# Patient Record
Sex: Male | Born: 1970 | Race: White | Hispanic: No | Smoking: Never smoker
Health system: Southern US, Community
[De-identification: ages and names within clinical notes are randomized; demographics above are authoritative.]

## PROBLEM LIST (undated history)

## (undated) DIAGNOSIS — J189 Pneumonia, unspecified organism: Secondary | ICD-10-CM

## (undated) DIAGNOSIS — G4730 Sleep apnea, unspecified: Secondary | ICD-10-CM

## (undated) HISTORY — PX: KNEE SURGERY: SHX244

## (undated) HISTORY — PX: COLONOSCOPY: SHX174

---
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
## 2009-02-05 ENCOUNTER — Ambulatory Visit: Payer: Self-pay | Admitting: Surgery

## 2009-02-06 ENCOUNTER — Inpatient Hospital Stay (HOSPITAL_COMMUNITY): Admission: EM | Admit: 2009-02-06 | Discharge: 2009-02-13 | Payer: Self-pay | Admitting: Emergency Medicine

## 2009-02-08 ENCOUNTER — Encounter (INDEPENDENT_AMBULATORY_CARE_PROVIDER_SITE_OTHER): Payer: Self-pay | Admitting: Interventional Radiology

## 2009-02-10 ENCOUNTER — Encounter (INDEPENDENT_AMBULATORY_CARE_PROVIDER_SITE_OTHER): Payer: Self-pay | Admitting: Internal Medicine

## 2009-04-16 ENCOUNTER — Encounter: Admission: RE | Admit: 2009-04-16 | Discharge: 2009-04-16 | Payer: Self-pay | Admitting: Family Medicine

## 2009-08-19 ENCOUNTER — Encounter: Admission: RE | Admit: 2009-08-19 | Discharge: 2009-08-19 | Payer: Self-pay | Admitting: Internal Medicine

## 2009-11-01 HISTORY — PX: SCROTUM EXPLORATION: SHX2389

## 2010-03-12 IMAGING — CR DG CHEST DECUBITUS*L*
1 series · 1 of 1 positions shown · non-contrast
Comparison: 02/08/2009

CLINICAL DATA: Evaluate pleural effusion

CHEST - LEFT DECUBITUS

[view not recorded]
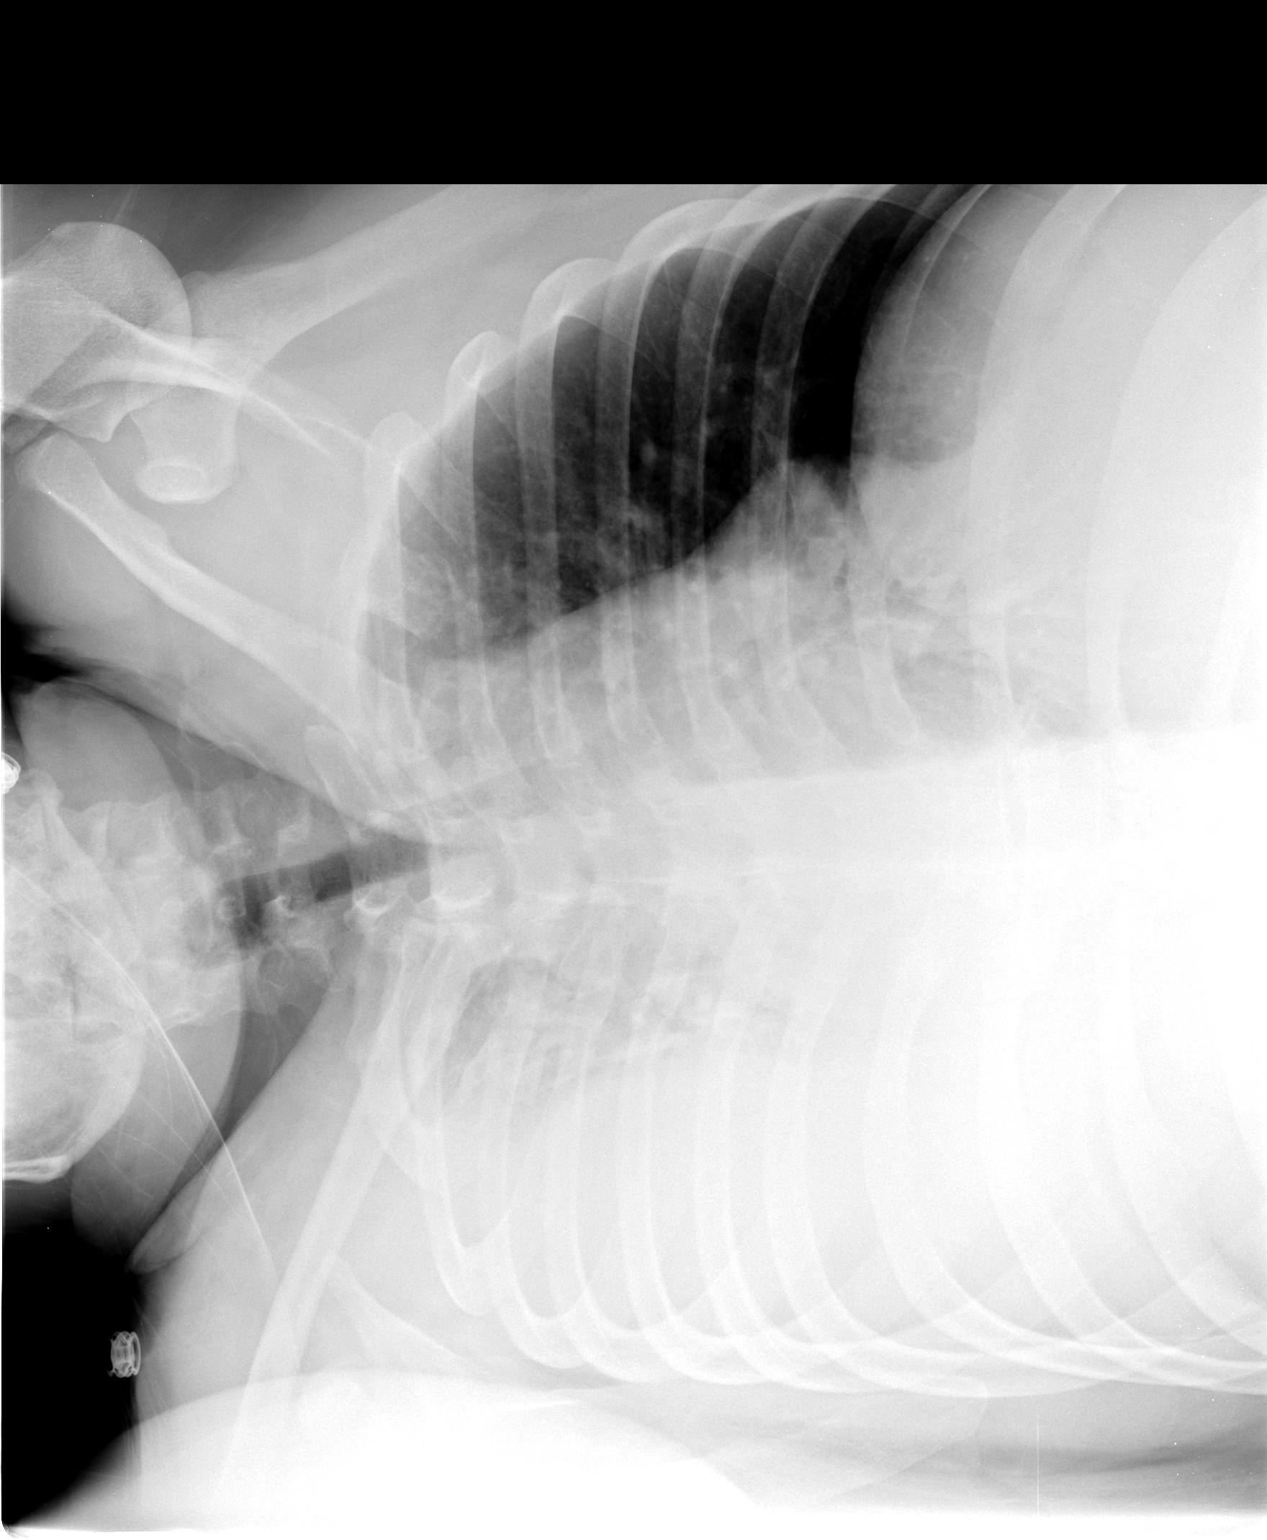

[1 of 1 positions shown; findings below may reference images not displayed]

FINDINGS: The large left pleural effusion is noted to layer along
the dependent portion of the left lung.

No right pleural effusion is noted.
IMPRESSION: 1.  Left pleural effusion appears free-flowing.

## 2010-03-12 IMAGING — US US PARACENTESIS
1 series · 3 of 3 positions shown · non-contrast
Comparison: none

CLINICAL DATA: Left lower lobe pneumonia with rapid development of
left pleural effusion.

LEFTULTRASOUND-GUIDED THORACENTESIS
TECHNIQUE: An ultrasound-guided thoracentesis was thoroughly
discussed with the patient and questions answered.  The benefits,
risks, alternatives and complications were also discussed.  The
patient understands and wishes to proceed with the procedure.  A
verbal as well as written consent was obtained.

[Series 1: us paracentesis · 0.32mm/px · 3 of 3 slices shown]
[im 1/3]
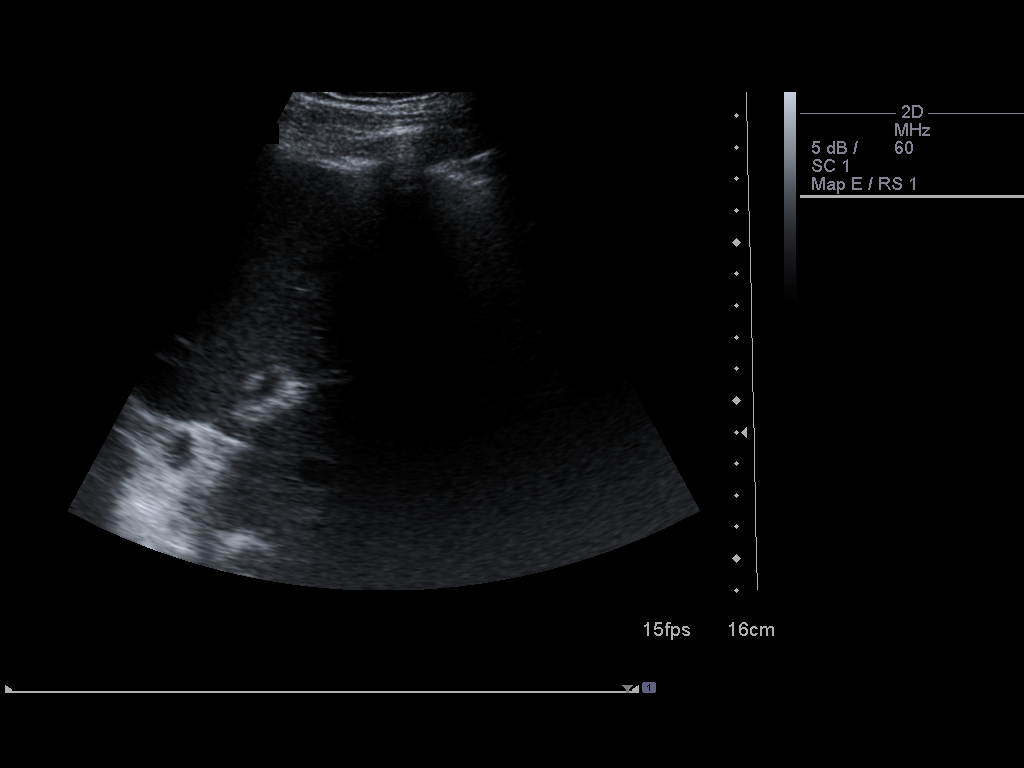
[im 2/3]
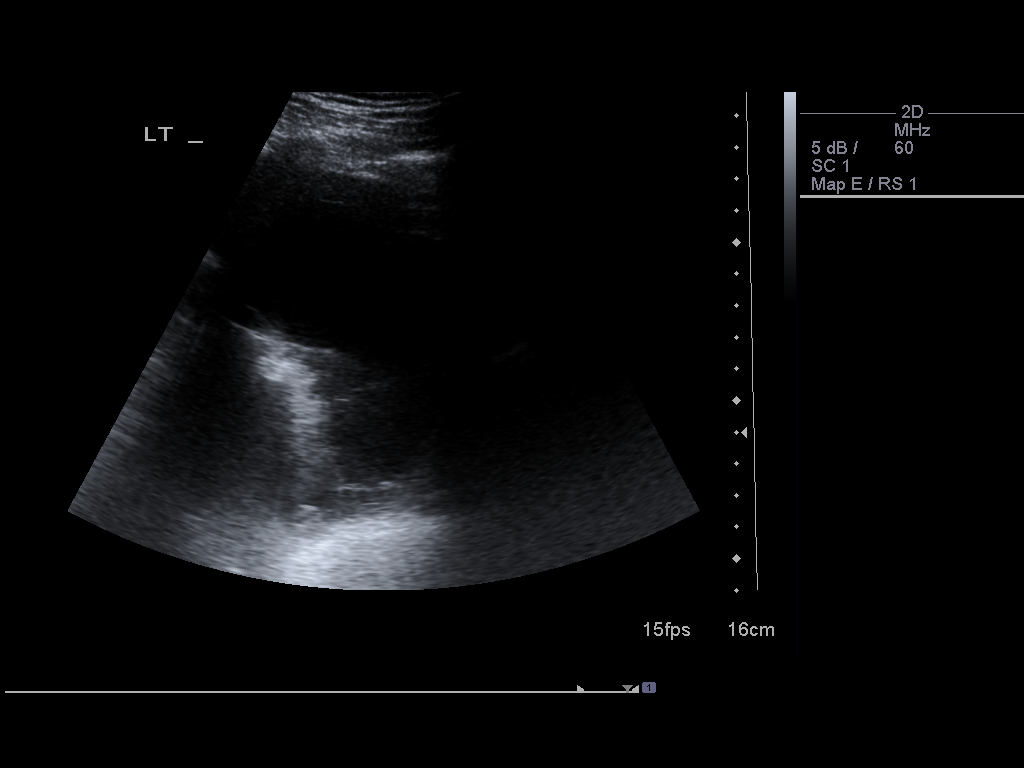
[im 3/3]
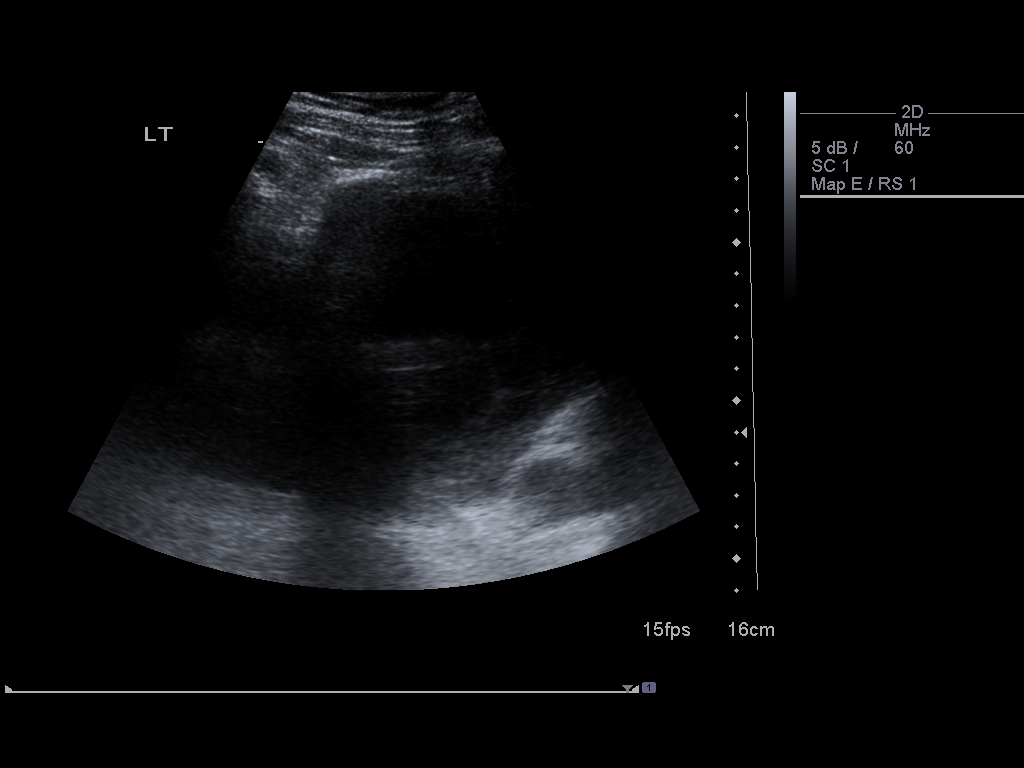

[3 of 3 positions shown; findings below may reference images not displayed]

Ultrasound was performed to localize and mark an adequate pocket of
fluid for thoracentesis.  The left posterior chest wall was prepped
and draped in the normal sterile fashion.  1% Lidocaine was used
for local anesthesia.  Under ultrasound guidance a 19-gauge Yueh
catheter was introduced yielding approximately 500 ml of slightly
turbid fluid.  The patient tolerated the procedure well and there
were no immediate complications. Post procedure chest x-ray is
pending.

By ultrasound, the left pleural effusion appears at least partially
loculated.
IMPRESSION: Successful ultrasound-guided left thoracentesis yielding 500 ml of
slightly turbid fluid.  Samples were sent for requested laboratory
tests.

## 2010-03-14 IMAGING — CR DG CHEST 1V PORT
1 series · 1 of 1 positions shown · non-contrast
Comparison: 02/09/2009

CLINICAL DATA: Pneumonia, follow up empyema drainage, chest tube
placement

PORTABLE CHEST - 1 VIEW

[view not recorded]
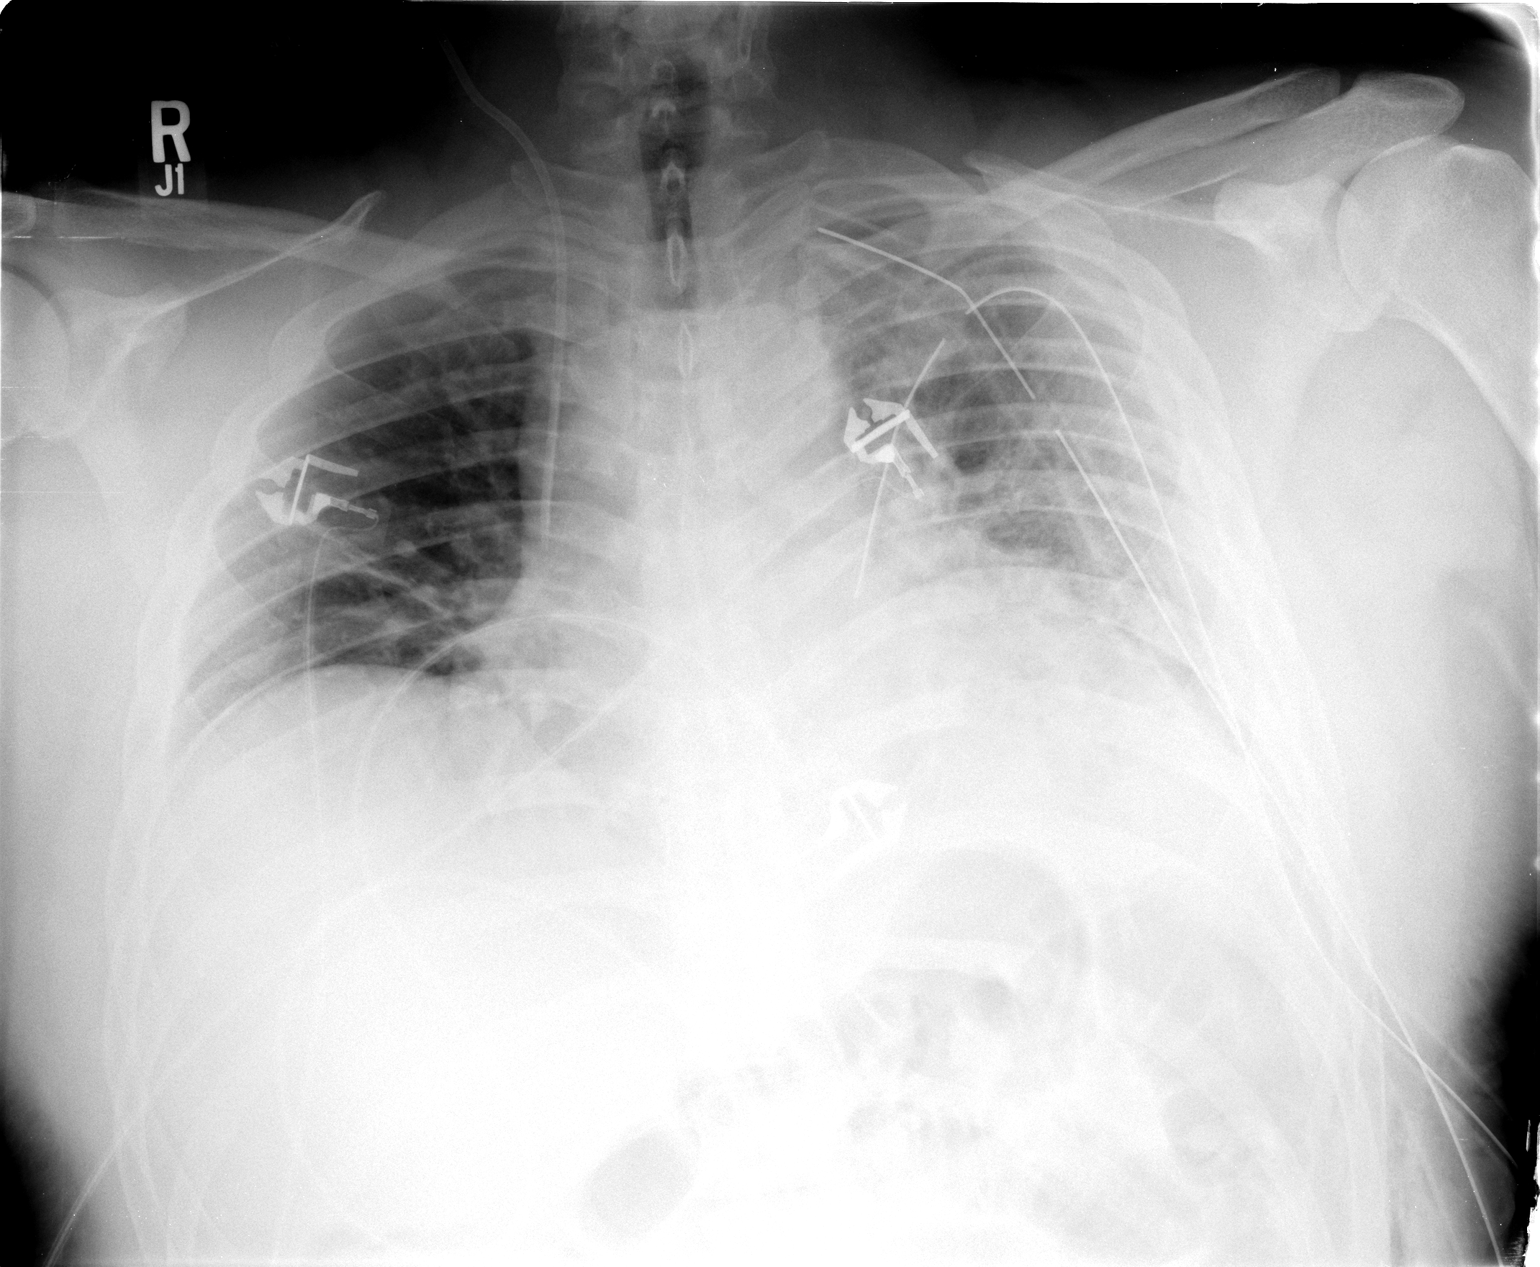

[1 of 1 positions shown; findings below may reference images not displayed]

FINDINGS: Two left chest tubes in good position.  Central venous
catheter unchanged with tip SVC/RA junction.  No pneumothorax seen.
Mild vascular congestion, atelectasis, infiltrate on the left.
Minimal atelectasis right base.  Little change aeration.
IMPRESSION: Stable aeration.

## 2010-03-15 IMAGING — CR DG CHEST 1V PORT
1 series · 1 of 1 positions shown · non-contrast
Comparison: Portable chest x-ray of 02/10/2009

CLINICAL DATA: Status post VATS, follow-up

PORTABLE CHEST - 1 VIEW

[view not recorded]
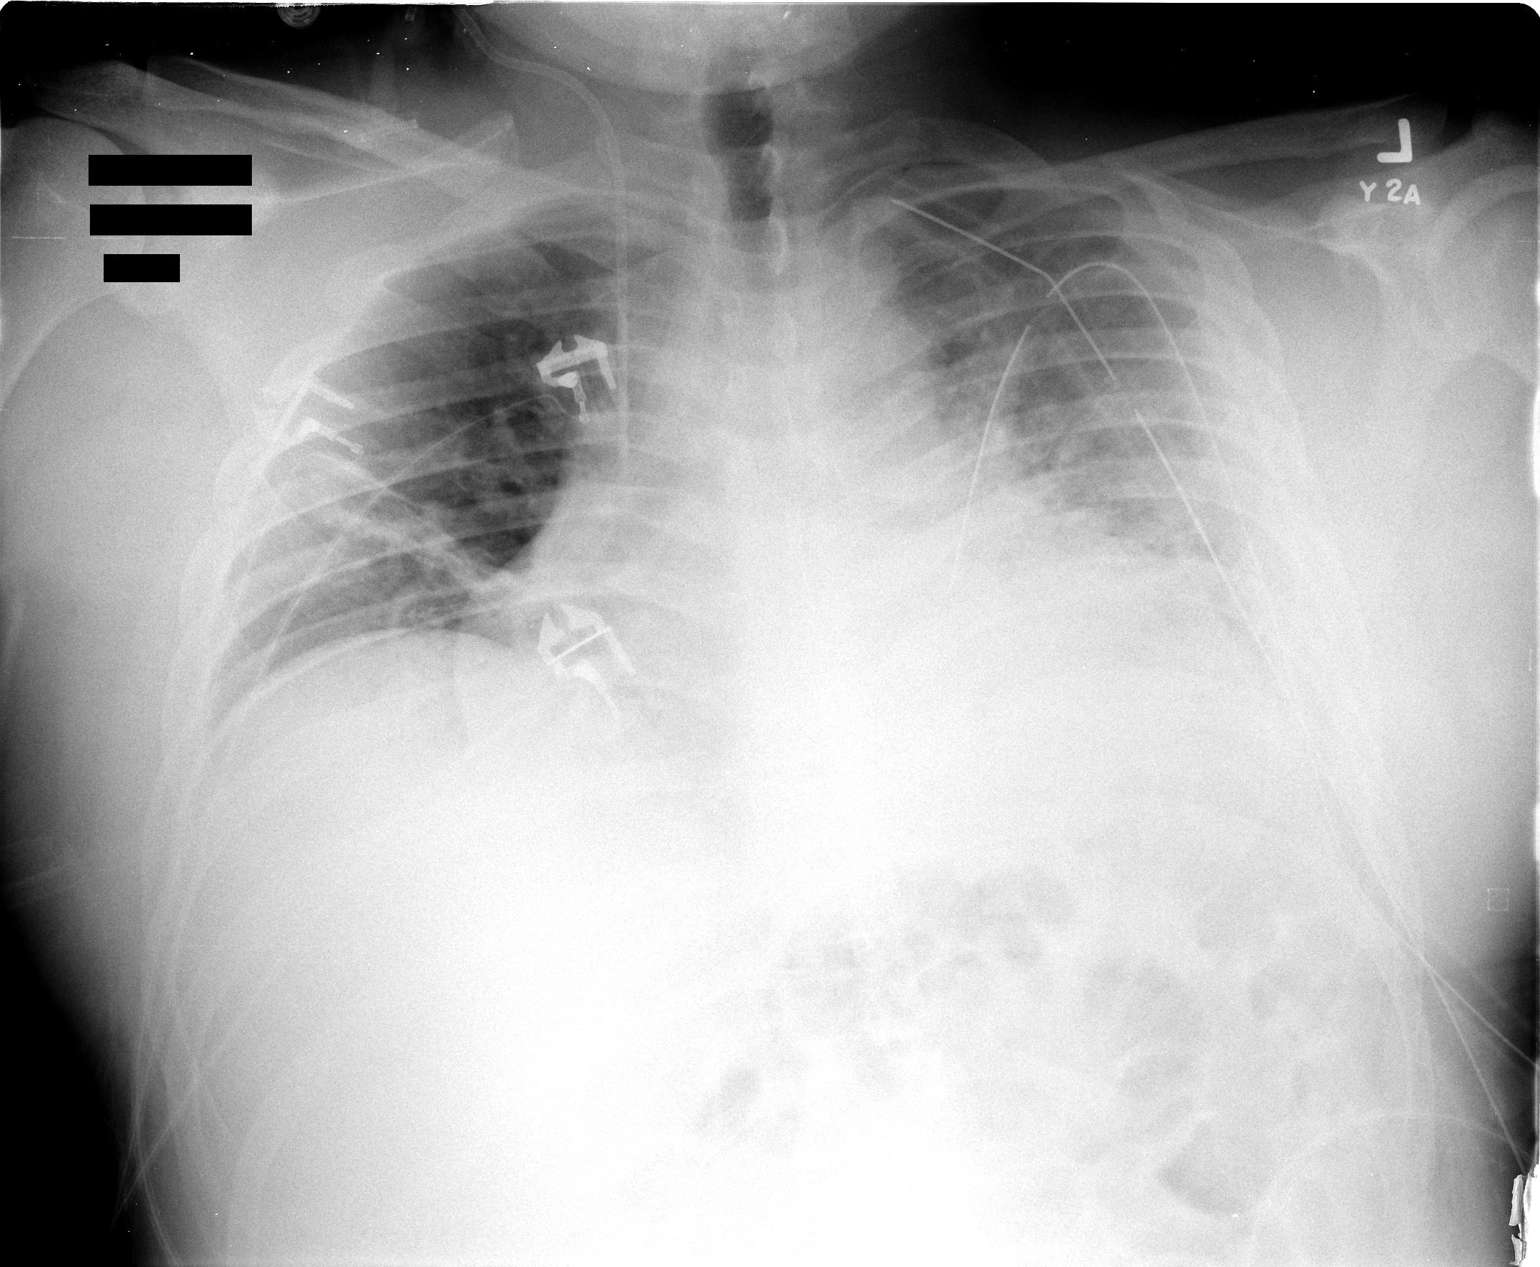

[1 of 1 positions shown; findings below may reference images not displayed]

FINDINGS: Two left chest tubes remain in the lungs remain
suboptimally aerated.  There is some opacity at the lung bases left
greater than right most consistent with atelectasis.  Right IJ
central venous catheter is unchanged in position. Moderate
cardiomegaly is stable.
IMPRESSION: No significant change in poor aeration with bibasilar opacities
left greater than right.

## 2010-03-16 IMAGING — CR DG CHEST 1V PORT
1 series · 1 of 1 positions shown · non-contrast
Comparison: 02/12/2009

CLINICAL DATA: Pneumothorax, chest tube removal.

PORTABLE CHEST - 1 VIEW

[AP]
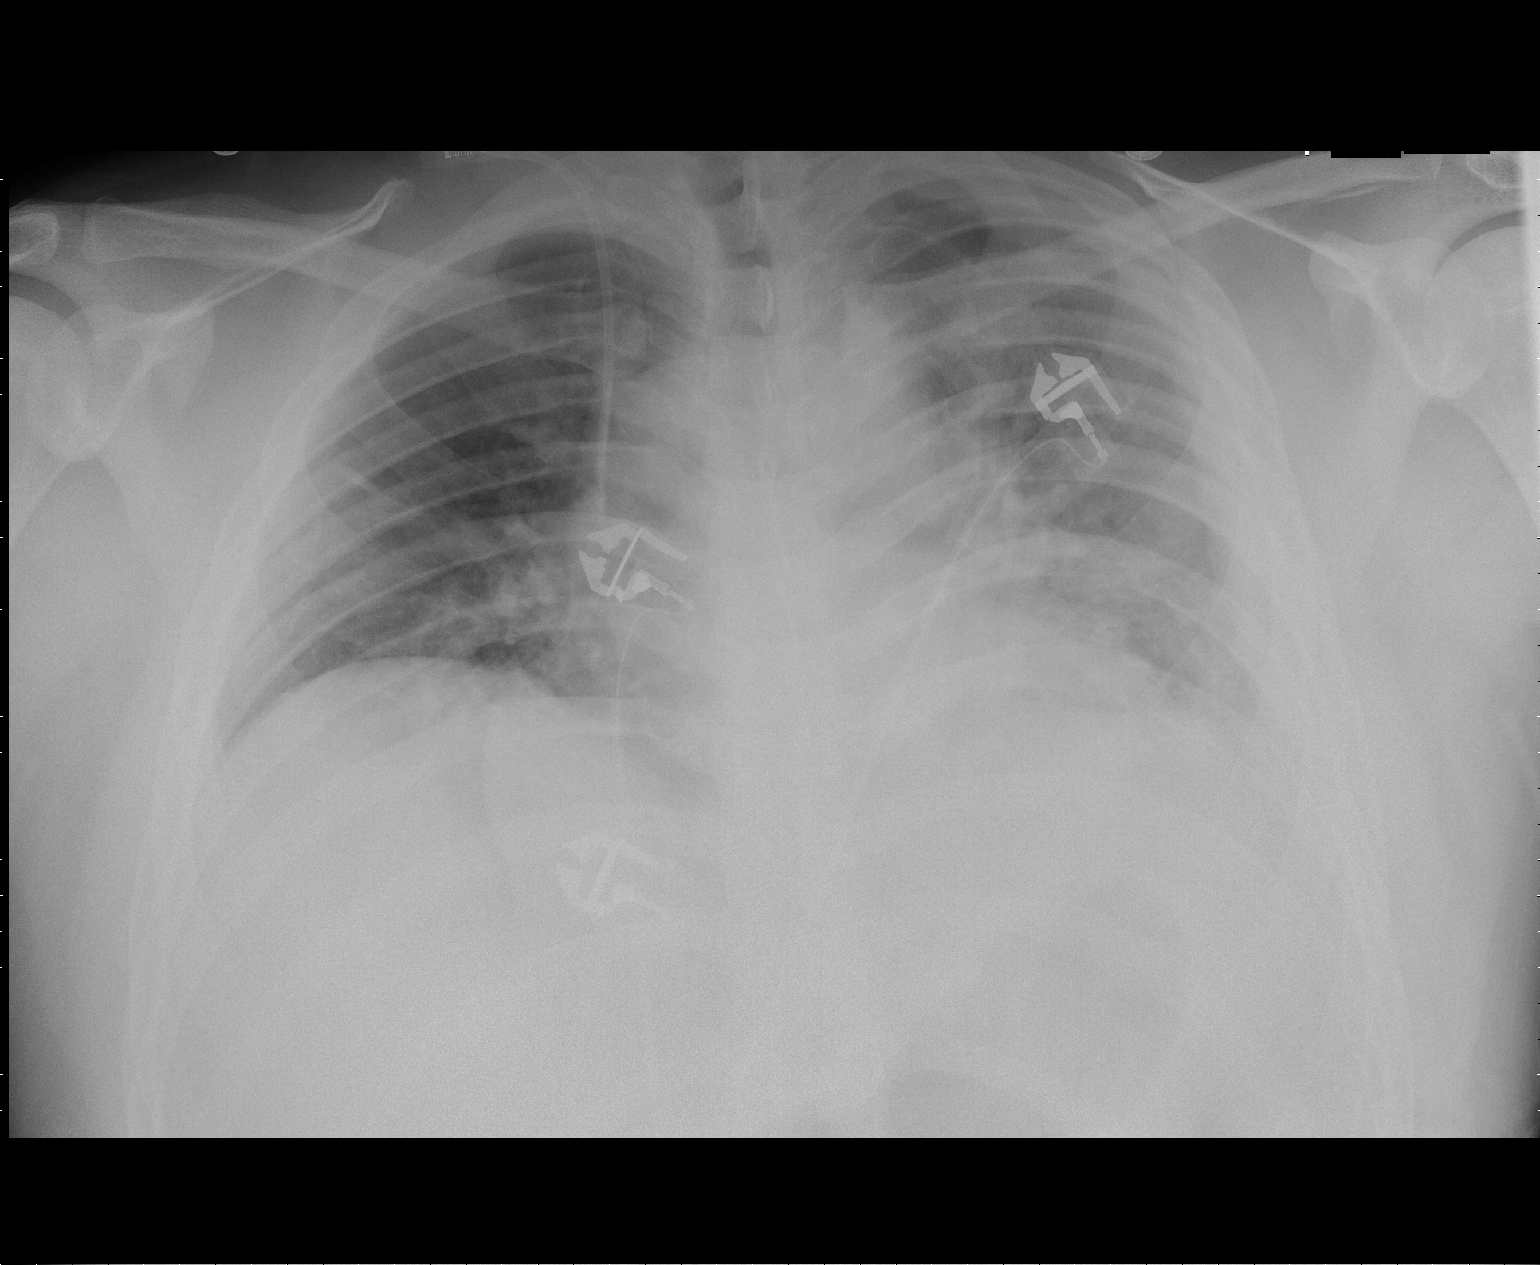

[1 of 1 positions shown; findings below may reference images not displayed]

FINDINGS: Trachea is midline.  Heart size is grossly stable.  Right
IJ central line tip projects over the SVC.  Left chest tube has
been removed in the interval.  Added density in the medial aspect
of the left upper lobe is likely due to a chest tube tract.  No
definite pneumothorax.  Lungs are low in volume with left lung air
space disease.  Right basilar atelectasis.
IMPRESSION: 1.  No definite pneumothorax after left chest tube removal.
2.  Left lung air space disease and left pleural thickening or
fluid.
3.  Right base atelectasis.

## 2010-11-01 HISTORY — PX: KNEE ARTHROPLASTY: SHX992

## 2011-02-10 LAB — BLOOD GAS, ARTERIAL
Acid-Base Excess: 1.9 mmol/L (ref 0.0–2.0)
Bicarbonate: 26.2 mEq/L — ABNORMAL HIGH (ref 20.0–24.0)
TCO2: 27.5 mmol/L (ref 0–100)
pCO2 arterial: 42.8 mmHg (ref 35.0–45.0)
pO2, Arterial: 60.3 mmHg — ABNORMAL LOW (ref 80.0–100.0)

## 2011-02-10 LAB — BASIC METABOLIC PANEL
BUN: 10 mg/dL (ref 6–23)
BUN: 9 mg/dL (ref 6–23)
CO2: 25 mEq/L (ref 19–32)
CO2: 27 mEq/L (ref 19–32)
Calcium: 8.2 mg/dL — ABNORMAL LOW (ref 8.4–10.5)
Calcium: 8.4 mg/dL (ref 8.4–10.5)
Calcium: 8.7 mg/dL (ref 8.4–10.5)
Chloride: 105 mEq/L (ref 96–112)
Chloride: 106 mEq/L (ref 96–112)
Creatinine, Ser: 0.97 mg/dL (ref 0.4–1.5)
Creatinine, Ser: 1.06 mg/dL (ref 0.4–1.5)
GFR calc Af Amer: >60 mL/min (ref >60)
GFR calc Af Amer: >60 mL/min (ref >60)
GFR calc Af Amer: >60 mL/min (ref >60)
GFR calc non Af Amer: >60 mL/min (ref >60)
GFR calc non Af Amer: >60 mL/min (ref >60)
GFR calc non Af Amer: >60 mL/min (ref >60)
GFR calc non Af Amer: >60 mL/min (ref >60)
Glucose, Bld: 86 mg/dL (ref 70–99)
Glucose, Bld: 94 mg/dL (ref 70–99)
Potassium: 3.9 mEq/L (ref 3.5–5.1)
Potassium: 4 mEq/L (ref 3.5–5.1)
Potassium: 4.2 mEq/L (ref 3.5–5.1)
Sodium: 133 mEq/L — ABNORMAL LOW (ref 135–145)
Sodium: 137 mEq/L (ref 135–145)
Sodium: 138 mEq/L (ref 135–145)

## 2011-02-10 LAB — DIFFERENTIAL
Basophils Absolute: 0 10*3/uL (ref 0.0–0.1)
Eosinophils Relative: 5 % (ref 0–5)
Lymphocytes Relative: 24 % (ref 12–46)
Lymphocytes Relative: 9 % — ABNORMAL LOW (ref 12–46)
Lymphs Abs: 1.1 10*3/uL (ref 0.7–4.0)
Lymphs Abs: 2.3 10*3/uL (ref 0.7–4.0)
Monocytes Absolute: 0.8 10*3/uL (ref 0.1–1.0)
Monocytes Relative: 4 % (ref 3–12)
Neutro Abs: 10.7 10*3/uL — ABNORMAL HIGH (ref 1.7–7.7)
Neutrophils Relative %: 62 % (ref 43–77)
Neutrophils Relative %: 86 % — ABNORMAL HIGH (ref 43–77)

## 2011-02-10 LAB — CBC
HCT: 35.6 % — ABNORMAL LOW (ref 39.0–52.0)
HCT: 42.5 % (ref 39.0–52.0)
HCT: 43 % (ref 39.0–52.0)
HCT: 45.8 % (ref 39.0–52.0)
Hemoglobin: 12.4 g/dL — ABNORMAL LOW (ref 13.0–17.0)
Hemoglobin: 12.8 g/dL — ABNORMAL LOW (ref 13.0–17.0)
Hemoglobin: 14.5 g/dL (ref 13.0–17.0)
Hemoglobin: 14.7 g/dL (ref 13.0–17.0)
Hemoglobin: 15.9 g/dL (ref 13.0–17.0)
MCHC: 33.7 g/dL (ref 30.0–36.0)
MCHC: 34.3 g/dL (ref 30.0–36.0)
MCHC: 34.5 g/dL (ref 30.0–36.0)
MCHC: 34.6 g/dL (ref 30.0–36.0)
MCHC: 34.7 g/dL (ref 30.0–36.0)
MCV: 92.3 fL (ref 78.0–100.0)
MCV: 93 fL (ref 78.0–100.0)
Platelets: 238 10*3/uL (ref 150–400)
Platelets: 244 10*3/uL (ref 150–400)
Platelets: 248 10*3/uL (ref 150–400)
RBC: 3.77 MIL/uL — ABNORMAL LOW (ref 4.22–5.81)
RBC: 4.01 MIL/uL — ABNORMAL LOW (ref 4.22–5.81)
RBC: 4.55 MIL/uL (ref 4.22–5.81)
RBC: 4.55 MIL/uL (ref 4.22–5.81)
RDW: 13.2 % (ref 11.5–15.5)
RDW: 13.2 % (ref 11.5–15.5)
RDW: 13.6 % (ref 11.5–15.5)
WBC: 10 10*3/uL (ref 4.0–10.5)
WBC: 12.5 10*3/uL — ABNORMAL HIGH (ref 4.0–10.5)
WBC: 9.4 10*3/uL (ref 4.0–10.5)

## 2011-02-10 LAB — ANAEROBIC CULTURE

## 2011-02-10 LAB — COMPREHENSIVE METABOLIC PANEL
ALT: 22 U/L (ref 0–53)
ALT: 34 U/L (ref 0–53)
ALT: 43 U/L (ref 0–53)
AST: 27 U/L (ref 0–37)
Albumin: 2.6 g/dL — ABNORMAL LOW (ref 3.5–5.2)
CO2: 29 mEq/L (ref 19–32)
Calcium: 8.1 mg/dL — ABNORMAL LOW (ref 8.4–10.5)
Calcium: 8.1 mg/dL — ABNORMAL LOW (ref 8.4–10.5)
Chloride: 105 mEq/L (ref 96–112)
Creatinine, Ser: 1.15 mg/dL (ref 0.4–1.5)
GFR calc Af Amer: >60 mL/min (ref >60)
GFR calc Af Amer: >60 mL/min (ref >60)
GFR calc non Af Amer: >60 mL/min (ref >60)
GFR calc non Af Amer: >60 mL/min (ref >60)
Glucose, Bld: 73 mg/dL (ref 70–99)
Glucose, Bld: 88 mg/dL (ref 70–99)
Potassium: 3.7 mEq/L (ref 3.5–5.1)
Potassium: 3.9 mEq/L (ref 3.5–5.1)
Sodium: 137 mEq/L (ref 135–145)
Sodium: 139 mEq/L (ref 135–145)
Total Bilirubin: 0.6 mg/dL (ref 0.3–1.2)
Total Protein: 5.8 g/dL — ABNORMAL LOW (ref 6.0–8.3)
Total Protein: 7.2 g/dL (ref 6.0–8.3)

## 2011-02-10 LAB — AFB CULTURE WITH SMEAR (NOT AT ARMC)

## 2011-02-10 LAB — URINALYSIS, ROUTINE W REFLEX MICROSCOPIC
Hgb urine dipstick: NEGATIVE
Ketones, ur: NEGATIVE mg/dL
Nitrite: NEGATIVE

## 2011-02-10 LAB — BODY FLUID CELL COUNT WITH DIFFERENTIAL
Eos, Fluid: 1 %
Lymphs, Fluid: 9 %
Monocyte-Macrophage-Serous Fluid: 10 % — ABNORMAL LOW (ref 50–90)

## 2011-02-10 LAB — CROSSMATCH: ABO/RH(D): A POS

## 2011-02-10 LAB — BODY FLUID CULTURE: Culture: NO GROWTH

## 2011-02-10 LAB — POCT CARDIAC MARKERS: Troponin i, poc: <0.05 ng/mL (ref 0.00–0.09)

## 2011-02-10 LAB — POCT I-STAT, CHEM 8
BUN: 17 mg/dL (ref 6–23)
Calcium, Ion: 1.15 mmol/L (ref 1.12–1.32)
Chloride: 106 mEq/L (ref 96–112)
Creatinine, Ser: 1.2 mg/dL (ref 0.4–1.5)

## 2011-02-10 LAB — CK TOTAL AND CKMB (NOT AT ARMC)
CK, MB: 1.6 ng/mL (ref 0.3–4.0)
Relative Index: 0.9 (ref 0.0–2.5)

## 2011-02-10 LAB — LACTATE DEHYDROGENASE, PLEURAL OR PERITONEAL FLUID: LD, Fluid: 1091 U/L — ABNORMAL HIGH (ref 3–23)

## 2011-02-10 LAB — TROPONIN I: Troponin I: <0.01 ng/mL (ref 0.00–0.06)
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
## 2011-03-16 NOTE — Consult Note (Signed)
Glenn Ortiz, STARY NO.:  [id number]   MEDICAL RECORD NO.:  [id number]          PATIENT TYPE:  INP   LOCATION:  2550                         FACILITY:  [facility]   PHYSICIAN:  Evelene Croon, M.D.     DATE OF BIRTH:  1971/06/07   DATE OF CONSULTATION:  02/08/2009  DATE OF DISCHARGE:                                 CONSULTATION   REFERRING PHYSICIAN:  Candyce Churn, MD   REASON FOR CONSULTATION:  Large left pleural effusion, rule out empyema.   CLINICAL HISTORY:  I was asked by Dr. Kevan Ny to evaluate Mr. Ratay for  consideration of drainage of a large left pleural effusion that was felt  to be an empyema.  He is a 40 year old previously-healthy gentleman who  does have a history of pneumothorax about 17 years ago, who presented to  the emergency room on February 05, 2009, with a 4-day history of productive  cough with reddish-pink sputum, left pleuritic chest pain, shortness of  breath, and diaphoresis.  Chest x-ray at the time of presentation showed  left lower lobe pneumonia.  He underwent a CT scan on the chest, which  showed a left lower lobe pneumonia with a small area of parenchymal  crepitation.  There was no significant effusion at the time of  presentation.  There was no significant adenopathy.  CT of the abdomen  and pelvis was negative.  The patient was admitted and started on  intravenous antibiotics.  He remained stable, but on February 08, 2009, he  was noted to have increased shortness of breath, and a followup chest x-  ray showed a large left pleural effusion had  developed.  He underwent  thoracentesis by Interventional Radiology and only 500 mL could be  removed.  It appeared to be partially loculated by ultrasound.  The  fluid was slightly turbid.  Followup chest x-ray showed persistent  moderately left pleural effusion tracking up the left side of the chest,  which I felt was most likely loculated.   PAST MEDICAL HISTORY:  Significant for  history of pneumothorax, 17 years  ago.  He has had no prior surgery.  He denies any medical illnesses.   ALLERGIES:  CODEINE, WHICH CAUSES NAUSEA.   MEDICATIONS PRIOR TO ADMISSION:  1. Delsym p.r.n.  2. Zyrtec p.r.n.  3. Ibuprofen p.r.n.  4. Fish oil 1200 mg daily.  5. Multivitamin daily.   FAMILY HISTORY:  Negative.   SOCIAL HISTORY:  He is a nonsmoker and drinks occasional alcohol.  He  denies any drug use.  He is divorced.   REVIEW OF SYSTEMS:  GENERAL:  He denies any fever or chills.  He has had  no recent weight changes.  He denies fatigue.  EYES:  Negative.  ENT:  Negative.  ENDOCRINE:  He denies diabetes and hypothyroidism.  CARDIOVASCULAR:  He denies any anginal chest pain, but has had pleuritic  left chest pain.  He has had exertional dyspnea for the last 4 days.  He  denies PND and orthopnea.  RESPIRATORY:  He has had cough productive of  reddish-pink sputum.  He denies any wheezing.  GI:  He has had some  abdominal cramping with the stools.  He denies melena and bright red  blood per rectum.  GU:  He denies dysuria and hematuria.  MUSCULOSKELETAL:  He denies arthralgias and myalgias.  NEUROLOGICAL:  He  denies any focal weakness or numbness.  He has been generally weak.  He  has had some headaches.  He has never had a TIA or stroke.  HEMATOLOGICAL:  Negative.   PHYSICAL EXAMINATION:  VITAL SIGNS:  He is afebrile.  Blood pressure is  125/84.  Pulse is 90 and regular.  Respiratory rate is 18 and unlabored.  Oxygen saturation on room air is 92%.  GENERAL:  He is a well-developed muscular white male in no distress.  HEENT:  Normocephalic and atraumatic.  Pupils are equal and reactive to  light and accommodation.  Extraocular muscles are intact.  His throat is  clear.  NECK:  Normal carotid pulses bilaterally.  There is no adenopathy or  thyromegaly.  CARDIAC:  Regular rate and rhythm with normal S1 and S2.  There is no  murmur, rub, or gallop.  LUNGS:  Decreased  breath sounds over the left lower lobe, which were  tubular.  ABDOMEN:  Active bowel sounds.  Abdomen is soft and nontender.  There  are no palpable masses or organomegaly.  EXTREMITIES:  No peripheral edema.  Pedal pulses are palpable  bilaterally.  SKIN:  Warm and dry.   LABORATORY EXAMINATION:  Normal electrolytes with BUN of 10, creatinine  of 1.2.  White blood cell count is steadily increased and is 16.1 today,  hemoglobin of 14.5, hematocrit 43, platelet count 244,000.  Liver  function profile is within normal limits.  Albumin is 3.7.  Urinalysis  is negative.  Cardiac enzymes are negative.  Pleural fluid LDH is 1091  with cell count of 7650 white blood cells, 80% neutrophils, 9%  lymphocytes, 10% monocytes.  ESR is 28.  Culture is pending.   IMPRESSION:  Mr. Tetrault has a loculated moderately large left pleural  effusion in left lower lobe.  He has remained afebrile on antibiotics,  but his white count has been increasing, and he has developed shortness  of breath with minimal exertion such as getting up to sink.  I do not  think there is any role for insertion of the chest tube in this patient  since this is likely loculated thick fluid, which will not be drained  effectively through the chest tube.  I think the best treatment is to  proceed with a left thoracoscopy for complete drainage of the empyema by  breaking up the loculations and decortication of the lung.  I discussed  the operative procedure with the patient including alternatives,  benefits and risks including but not limited to bleeding, blood  transfusion, infection, injury to the lung, persistent pleural fluid  collections, persistent air leak, and he understands and agrees to  proceed.  We will plan to do this tomorrow.      Evelene Croon, M.D.  Electronically Signed     BB/MEDQ  D:  02/09/2009  T:  02/10/2009  Job:  [id number]   cc:   Candyce Churn, M.D.

## 2011-03-16 NOTE — Op Note (Signed)
NAMECAVAN, Glenn Ortiz NO.:  [id number]   MEDICAL RECORD NO.:  [id number]          PATIENT TYPE:  INP   LOCATION:  2550                         FACILITY:  [facility]   PHYSICIAN:  Evelene Croon, M.D.     DATE OF BIRTH:  [birth year]   DATE OF PROCEDURE:  02/09/2009  DATE OF DISCHARGE:                               OPERATIVE REPORT   PREOPERATIVE DIAGNOSIS:  Large left pleural empyema.   POSTOPERATIVE DIAGNOSIS:  Large left pleural empyema.   PROCEDURES:  Left video-assisted thoracoscopy, drainage of empyema, and  decortication of left lung.   ATTENDING SURGEON:  Evelene Croon, MD   ASSISTANT:  Stephanie Acre. Dasovich, PAC   ANESTHESIA:  General endotracheal.   CLINICAL HISTORY:  This patient is a 40 year old healthy gentleman who  presented with a left lower lobe pneumonia and developed progressive  left pleural effusion.  He had a ultrasound-guided left thoracentesis  performed by Interventional Radiology and only 500 mL could be  withdrawn.  The fluid was turbid.  Ultrasound suggested this was a  multiloculated effusion.  Followup chest x-ray showed continued large  left pleural effusion tracking at the left side of the chest, suggesting  that it was loculated.  I felt the best treatment would be thoracoscopy  with drainage and decortication.  I discussed operative procedure with  the patient including alternatives, benefits, and risks including but  not limited to bleeding, blood transfusion, infection, injury to lung,  persistent air leak, and recurrence of fluid collections.  He understood  all this and agreed to proceed.   OPERATIVE PROCEDURE:  The patient was taken to the operating room after  being seen in the holding area and confirming that was the appropriate  patient.  I confirmed the operative side and signed on the left side of  the chest.  In the operating room, the patient was placed under general  endotracheal anesthesia using a double-lumen tube.  He  was turned in the  right lateral decubitus position with the left side up and the left side  of the chest was prepped with Betadine soap and solution and draped in  the usual sterile manner.  Then a small incision was made in the  midaxillary line over the lower left chest wall and a 10-mm trocar was  inserted.  The 30-degree thoracoscope was inserted.  Examination of the  chest showed a complex multiloculated empyema with fibrinous stranding  and small pockets of serous fluid that was slightly turbid.  The lung  and the parietal pleural were both markedly hyperemic and friable.  There was fibrinous exudate over the lung.  Then a second incision was  made in the posterior axillary line slightly higher up.  A trocar was  also introduced through this incision.  Using these two incisions and  pair forceps was used to break up the loculations.  The fluid and debris  was suctioned.  The specimen was sent for culture.  The fibrinous  exudate over the lung was decorticated.  After I felt that all the  loculations were broken up and all the  fluid was removed, the chest was  irrigated with saline solution.  There appeared to be good hemostasis.  Then two chest tubes were placed.  A 36-French right-angle chest tube  was positioned through the anterior axillary incision and positioned in  the posterior costophrenic sulcus.  Then a 36-French straight tube was  positioned through a separate incision just anterior to this and  positioned posteriorly and up to the apex.  The lung was then  reinflated.  The posterior axillary incision was closed using 2-0 Vicryl  subcutaneous suture and a 3-0 Vicryl subcuticular skin closure.  The  sponge, needle, and instrument counts were correct according to the  scrub nurse.  A dry sterile dressing  was applied over the incision and around the chest tubes with short  Pleur-Evac suction.  The patient was then turned in the supine position,  extubated, and transported  to the Postanesthesia Anesthesia Care Unit in  satisfactory and stable condition.      Evelene Croon, M.D.  Electronically Signed     BB/MEDQ  D:  02/09/2009  T:  02/10/2009  Job:  [id number]

## 2011-04-16 LAB — CBC
HCT: 41.3 % (ref 39.0–52.0)
Hemoglobin: 14.3 g/dL (ref 13.0–17.0)
MCH: 30.8 pg (ref 26.0–34.0)
MCV: 89 fL (ref 78.0–100.0)
RBC: 4.64 MIL/uL (ref 4.22–5.81)

## 2011-04-16 LAB — BASIC METABOLIC PANEL
BUN: 20 mg/dL (ref 6–23)
CO2: 24 mEq/L (ref 19–32)
Calcium: 9.4 mg/dL (ref 8.4–10.5)
Glucose, Bld: 84 mg/dL (ref 70–99)
Sodium: 137 mEq/L (ref 135–145)

## 2011-04-20 ENCOUNTER — Ambulatory Visit (HOSPITAL_BASED_OUTPATIENT_CLINIC_OR_DEPARTMENT_OTHER)
Admission: RE | Admit: 2011-04-20 | Discharge: 2011-04-20 | Disposition: A | Discharge status: Home / Self Care | Payer: BC Managed Care – PPO | Source: Ambulatory Visit | Attending: Urology | Admitting: Urology

## 2011-04-20 DIAGNOSIS — N433 Hydrocele, unspecified: Secondary | ICD-10-CM | POA: Insufficient documentation

## 2011-04-20 DIAGNOSIS — Z01812 Encounter for preprocedural laboratory examination: Secondary | ICD-10-CM | POA: Insufficient documentation

## 2011-04-22 NOTE — Op Note (Signed)
  NAME:  Glenn Ortiz, Glenn Ortiz NO.:  [id number]  MEDICAL RECORD NO.:  [id number]  LOCATION:                                 FACILITY:  PHYSICIAN:  Courtney Paris, M.D.  DATE OF BIRTH:  DATE OF PROCEDURE:  04/20/2011 DATE OF DISCHARGE:                              OPERATIVE REPORT   PREOPERATIVE DIAGNOSIS:  Large right hydrocele.  POSTOPERATIVE DIAGNOSIS:  Large right hydrocele.  OPERATION:  Right hydrocelectomy.  ANESTHESIA:  General.  SURGEON:  Courtney Paris, M.D.  BRIEF HISTORY:  This 40 year old white male admitted with increasing right hydrocele for repair.  He had a right inguinal hernia in 1988, but did not have swelling until much later.  Scrotal ultrasound done in December 2011 showed normal right testis.  He enters to have this repaired at this time.  DESCRIPTION OF PROCEDURE:  The patient was placed on the operating room table in the supine position.  After satisfactory induction of general anesthesia, he was prepped and draped with Betadine in the usual sterile fashion.  Time-out was then performed and the patient and procedure then reconfirmed.  He had a very large right hydrocele that measured 15 x 12 cm on the right side.  A midline incision was made, carried down through the scrotal layers and opened the hydrocele sac.  The fluid was aspirated.  The testis was brought into the operative field, which looked normal.  A large hydrocele sac was sutured posteriorly behind the testis with a running 3-0 chromic locking suture.  Hemostasis was good and the testis was placed back in the right hemi-scrotal compartment and the scrotal layer closed with a running 3-0 chromic catgut suture and interrupted 4-0 chromic mattress sutures for the skin.  A 5 cc of 0.25% Marcaine was injected into the incision for postoperative pain relief. A dressing of collodion, Telfa, fluffs and a scrotal support were then applied.  The patient was then taken to the  recovery room in good condition, will be later discharged as an outpatient.     Courtney Paris, M.D.     HMK/MEDQ  D:  04/20/2011  T:  04/20/2011  Job:  [id number]  Electronically Signed by Vic Blackbird M.D. on 04/22/2011 05:35:21 PM

## 2011-08-09 ENCOUNTER — Ambulatory Visit (HOSPITAL_BASED_OUTPATIENT_CLINIC_OR_DEPARTMENT_OTHER)
Admission: RE | Admit: 2011-08-09 | Discharge: 2011-08-09 | Disposition: A | Discharge status: Home / Self Care | Payer: BC Managed Care – PPO | Source: Ambulatory Visit | Attending: Urology | Admitting: Urology

## 2011-08-09 ENCOUNTER — Other Ambulatory Visit: Payer: Self-pay | Admitting: Urology

## 2011-08-09 DIAGNOSIS — Z01812 Encounter for preprocedural laboratory examination: Secondary | ICD-10-CM | POA: Insufficient documentation

## 2011-08-09 DIAGNOSIS — N433 Hydrocele, unspecified: Secondary | ICD-10-CM | POA: Insufficient documentation

## 2011-08-09 DIAGNOSIS — Z302 Encounter for sterilization: Secondary | ICD-10-CM | POA: Insufficient documentation

## 2011-08-18 NOTE — Op Note (Signed)
NAMEKOLEMAN, MARLING NO.:  [id number]  MEDICAL RECORD NO.:  [id number]  LOCATION:                                 FACILITY:  PHYSICIAN:  Valetta Fuller, MD    DATE OF BIRTH:  Dec 17, 1970  DATE OF PROCEDURE:  08/09/2011 DATE OF DISCHARGE:                              OPERATIVE REPORT   PREOPERATIVE DIAGNOSES: 1. Recurrent right hydrocele. 2. Request for elective sterilization.  POSTOPERATIVE DIAGNOSES: 1. Recurrent right hydrocele. 2. Request for elective sterilization.  PROCEDURE PERFORMED:  Scrotal exploration, repair of right multiloculated hydrocele and bilateral vasectomy.  SURGEON:  Valetta Fuller, M.D.  ANESTHESIA:  General.  INDICATIONS:  Mr. Strength came in to see me because of an enlarged right hemiscrotum status post hydrocele repair by Dr. Vic Blackbird, 2-3 months ago.  The patient initially had a large right hydrocele and underwent what was described as uneventful hydrocele repair in the middle of June 2012.  The patient's clinical situation improved, but then he developed a significant progressive recurrent swelling in his right hemiscrotum to the point that it became similar in size as to prior to his original repair.  Clinical exam and ultrasound were consistent with recurrent fluid.  The patient had an ultrasound, which showed a multiloculated fluid collection around the testicle consistent with probable recurrent hydrocele.  The patient also requested bilateral vasectomy if surgery was undertaken.  We discussed the advantages, disadvantages risks, potential complications of this procedure and he elected to proceed with attempt at recurrent repair.  He also appeared to understand the benefits and risks of vasectomy.  TECHNIQUE AND FINDINGS:  The patient was brought to the operating room, where he had successful induction of general anesthesia.  He received perioperative Ancef and PAS compression boots.  He was prepped  and draped in the usual manner.  Standard median raphe incision was utilized.  We found that there was quite a bit of the adhesion to the tunica vaginalis from the dartos muscle.  Once we are able to enter the right hemiscrotal compartment, we found multiloculated pockets of fluid. All those fluid was clear and yellow consistent with seroma and hydrocele fluid.  No evidence of infection or hematoma.  We spent considerable time freeing up the tunica vaginalis from the underlying dartos muscle.  The collections of fluid were opened and then drained. Some redundant tunica vaginalis was excised with electrocautery.  The remaining tunic vaginalis was reefed in a lowered type manner with some plication sutures of Vicryl suture.  The vas deferens was identified. Approximately 2 cm segment was isolated and removed and the ends cauterized.  The more proximal end was buried in a separate adventitial plane from the distal aspect of the vas.  At the completion of the procedure, there did not appear to be any remaining pockets of fluid. The tunica vaginalis for the most part had been excised and the small remaining parts are reefed to reduce the risk of recurrent fluid collection.  The testis otherwise showed some thickening on the tunic albuginea, but no other obvious abnormalities or problems.  Through the median raphe incision, I was able to palpate the left vas.  This was then grasped with the vasal grasper and again a section removed.  The vas was handled in an analogous manner to the right side.  A Marcaine spermatic cord block was performed on the right side.  The testis was returned to the right hemiscrotum.  The scrotal incision was closed with multiple layers of Vicryl suture.  A Penrose drain was also placed on the inferior aspect of the scrotum due to the recurrent nature of the hydrocele to reduce the risk of significant hematoma formation.  This will be left indwelling for several days.   No obvious complications or problems occurred and the patient was brought to recovery room in stable condition.     Valetta Fuller, MD     DSG/MEDQ  D:  08/11/2011  T:  08/11/2011  Job:  [id number]  Electronically Signed by Barron Alvine M.D. on 08/18/2011 10:03:22 AM

## 2013-03-02 ENCOUNTER — Encounter (HOSPITAL_COMMUNITY): Payer: Self-pay | Admitting: *Deleted

## 2013-03-02 ENCOUNTER — Emergency Department (HOSPITAL_COMMUNITY)
Admission: EM | Admit: 2013-03-02 | Discharge: 2013-03-02 | Disposition: A | Discharge status: Home / Self Care | Payer: BC Managed Care – PPO | Attending: Emergency Medicine | Admitting: Emergency Medicine

## 2013-03-02 DIAGNOSIS — Z79899 Other long term (current) drug therapy: Secondary | ICD-10-CM | POA: Insufficient documentation

## 2013-03-02 DIAGNOSIS — Z880 Allergy status to penicillin: Secondary | ICD-10-CM | POA: Insufficient documentation

## 2013-03-02 DIAGNOSIS — M542 Cervicalgia: Secondary | ICD-10-CM

## 2013-03-02 DIAGNOSIS — R209 Unspecified disturbances of skin sensation: Secondary | ICD-10-CM | POA: Insufficient documentation

## 2013-03-02 NOTE — ED Provider Notes (Signed)
History     CSN: [id number]  Arrival date & time 03/02/13  1407   First MD Initiated Contact with Patient 03/02/13 1418      Chief Complaint  Patient presents with  . Neck Pain    (Consider location/radiation/quality/duration/timing/severity/associated sxs/prior treatment) HPI  Glenn Ortiz is a 42 y.o. male complaining of neck pain onset approximately 10 days ago he reports an associated paresthesia to the right fourth and fifth digit. Patient has been seen by his primary care doctor, and orthopedist, has had an MRI and has been evaluated by a chiropractor.  He is sent to the ED by his primary care doctor to get referral to a spine surgeon. He denies any weakness, trauma to the affected area, he does report a reduced range of motion. Rates his pain as severe however it is well-controlled with Percocet that he was given by his physician.  History reviewed. No pertinent past medical history.  History reviewed. No pertinent past surgical history.  History reviewed. No pertinent family history.  History  Substance Use Topics  . Smoking status: Not on file  . Smokeless tobacco: Not on file  . Alcohol Use: No      Review of Systems  Constitutional: Negative for fever.  Respiratory: Negative for shortness of breath.   Cardiovascular: Negative for chest pain.  Gastrointestinal: Negative for nausea, vomiting, abdominal pain and diarrhea.  Musculoskeletal:       Cervicalgia  All other systems reviewed and are negative.    Allergies  Penicillins  Home Medications   Current Outpatient Rx  Name  Route  Sig  Dispense  Refill  . Menthol, Topical Analgesic, (BIOFREEZE EX)   Apply externally   Apply 1 application topically 2 (two) times daily as needed.         . Multiple Vitamins-Minerals (MULTIVITAMIN WITH MINERALS) tablet   Oral   Take 1 tablet by mouth daily.         . naproxen sodium (ANAPROX) 220 MG tablet   Oral   Take 220 mg by mouth 3 (three) times daily as  needed.         . Omega-3 Fatty Acids (FISH OIL) 1000 MG CAPS   Oral   Take 3 capsules by mouth daily.         Marland Kitchen OVER THE COUNTER MEDICATION   Topical   Apply 1 application topically 2 (two) times daily as needed.         . rosuvastatin (CRESTOR) 5 MG tablet   Oral   Take 5 mg by mouth daily.           BP 144/95  Pulse 88  Temp(Src) 97.8 F (36.6 C) (Oral)  Resp 18  SpO2 99%  Physical Exam  Nursing note and vitals reviewed. Constitutional: He is oriented to person, place, and time. He appears well-developed and well-nourished. No distress.  HENT:  Head: Normocephalic.  Mouth/Throat: Oropharynx is clear and moist.  Eyes: Conjunctivae and EOM are normal. Pupils are equal, round, and reactive to light.  Neck: Normal range of motion. Neck supple.  No midline tenderness to palpation or step-offs appreciated. Patient has full range of motion without pain.   Cardiovascular: Normal rate, regular rhythm, normal heart sounds and intact distal pulses.   Pulmonary/Chest: Effort normal and breath sounds normal. No stridor.  Abdominal: Soft.  Musculoskeletal: Normal range of motion.  Neurological: He is alert and oriented to person, place, and time.  Strength is 5 out of 5x4  extremities, neurovascularly intact and sensation is intact to both pinprick and light touch in all distributions on the upper extremities  Psychiatric: He has a normal mood and affect.    ED Course  Procedures (including critical care time)  Labs Reviewed - No data to display No results found.   1. Cervicalgia       MDM   JAK HAGGAR is a 42 y.o. male with neck pain and paresthesia to right fourth and fifth digit, patient has full range of motion in the neck, shoulder, elbow and arm strength is excellent and 5 out of 5 throughout, patient is able to delineate between pinprick and light touch: There is no emergent indication for neurosurgical intervention at this time.  I have given him a  referral to neurosurgeon Dr. Marlyn Corporal, however have explained to him that I do not think that it will be any surgical intervention immediately I have encouraged him to followup with his orthopedist and go through physical therapy.    Filed Vitals:   03/02/13 1413  BP: 144/95  Pulse: 88  Temp: 97.8 F (36.6 C)  TempSrc: Oral  Resp: 18  SpO2: 99%     Pt verbalized understanding and agrees with care plan. Outpatient follow-up and return precautions given.           Wynetta Emery, PA-C 03/02/13 1628  Wynetta Emery, PA-C 03/02/13 2130

## 2013-03-02 NOTE — ED Provider Notes (Signed)
Medical screening examination/treatment/procedure(s) were performed by non-physician practitioner and as supervising physician I was immediately available for consultation/collaboration.  Flint Melter, MD 03/02/13 2219

## 2013-03-02 NOTE — ED Notes (Signed)
Reports having severe neck pain. Has been diagnosed with pinched nerve and sent to chiropractor and ortho md, no relief and pain is radiating down right arm. Ambulatory at triage, no acute dsitress noted at this time.

## 2013-03-12 ENCOUNTER — Ambulatory Visit
Admission: RE | Admit: 2013-03-12 | Discharge: 2013-03-12 | Disposition: A | Discharge status: Home / Self Care | Payer: BC Managed Care – PPO | Source: Ambulatory Visit | Attending: Orthopedic Surgery | Admitting: Orthopedic Surgery

## 2013-03-12 ENCOUNTER — Other Ambulatory Visit: Payer: Self-pay | Admitting: Orthopedic Surgery

## 2013-03-12 DIAGNOSIS — M542 Cervicalgia: Secondary | ICD-10-CM

## 2013-03-12 DIAGNOSIS — M5410 Radiculopathy, site unspecified: Secondary | ICD-10-CM

## 2013-03-27 ENCOUNTER — Other Ambulatory Visit: Payer: Self-pay | Admitting: Orthopedic Surgery

## 2013-03-29 ENCOUNTER — Encounter (HOSPITAL_COMMUNITY): Payer: Self-pay | Admitting: Pharmacy Technician

## 2013-04-02 NOTE — Pre-Procedure Instructions (Addendum)
Glenn Ortiz  04/02/2013   Your procedure is scheduled on:  Thursday, June 5th.  Report to [facility] at 9:10 AM. Enter the hospital through the main Entrance, got to [facility] and take it to 3rd floor, Short Stay.  Call this number if you have problems the morning of surgery: [phone]   Remember:   Do not eat food or drink liquids after midnight.   Take these medicines the morning of surgery with A SIP OF WATER: Take if needed:traMADol (ULTRAM)     Do not wear jewelry, make-up or nail polish.  Do not wear lotions, powders, or perfumes. You may wear deodorant.  Do not shave 48 hours prior to surgery. Men may shave face and neck.  Do not bring valuables to the hospital.  [facility] is not responsible  or any belongings or valuables.  Contacts, dentures or bridgework may not be worn into surgery.  Leave suitcase in the car. After surgery it may be brought to your room.  For patients admitted to the hospital, checkout time is 11:00 AM the day of discharge.   Special Instructions: Shower using CHG 2 nights before surgery and the night before surgery.  If you shower the day of surgery use CHG.  Use special wash - you have one bottle of CHG for all showers.  You should use approximately 1/3 of the bottle for each shower.   Please read over the following fact sheets that you were given: Pain Booklet, Coughing and Deep Breathing, Blood Transfusion Information and Surgical Site Infection Prevention

## 2013-04-03 ENCOUNTER — Encounter (HOSPITAL_COMMUNITY): Payer: Self-pay

## 2013-04-03 ENCOUNTER — Encounter (HOSPITAL_COMMUNITY)
Admission: RE | Admit: 2013-04-03 | Discharge: 2013-04-03 | Disposition: A | Discharge status: Home / Self Care | Payer: BC Managed Care – PPO | Source: Ambulatory Visit | Attending: Orthopedic Surgery | Admitting: Orthopedic Surgery

## 2013-04-03 HISTORY — DX: Pneumonia, unspecified organism: J18.9

## 2013-04-03 LAB — CBC WITH DIFFERENTIAL/PLATELET
Basophils Relative: 0 % (ref 0–1)
Eosinophils Absolute: 0.2 10*3/uL (ref 0.0–0.7)
HCT: 41.3 % (ref 39.0–52.0)
Hemoglobin: 14.9 g/dL (ref 13.0–17.0)
Lymphs Abs: 1.4 10*3/uL (ref 0.7–4.0)
MCH: 32 pg (ref 26.0–34.0)
MCHC: 36.1 g/dL — ABNORMAL HIGH (ref 30.0–36.0)
Monocytes Absolute: 0.6 10*3/uL (ref 0.1–1.0)
Monocytes Relative: 10 % (ref 3–12)
Neutro Abs: 3.5 10*3/uL (ref 1.7–7.7)
RBC: 4.66 MIL/uL (ref 4.22–5.81)

## 2013-04-03 LAB — TYPE AND SCREEN

## 2013-04-03 LAB — URINALYSIS, ROUTINE W REFLEX MICROSCOPIC
Bilirubin Urine: NEGATIVE
Glucose, UA: NEGATIVE mg/dL
Hgb urine dipstick: NEGATIVE
Ketones, ur: NEGATIVE mg/dL
Nitrite: NEGATIVE
Specific Gravity, Urine: 1.016 (ref 1.005–1.030)
pH: 6 (ref 5.0–8.0)

## 2013-04-03 LAB — SURGICAL PCR SCREEN
MRSA, PCR: NEGATIVE
Staphylococcus aureus: POSITIVE — AB

## 2013-04-03 LAB — COMPREHENSIVE METABOLIC PANEL
Albumin: 3.8 g/dL (ref 3.5–5.2)
Alkaline Phosphatase: 59 U/L (ref 39–117)
BUN: 14 mg/dL (ref 6–23)
Chloride: 103 mEq/L (ref 96–112)
Creatinine, Ser: 0.79 mg/dL (ref 0.50–1.35)
GFR calc Af Amer: >90 mL/min (ref >90)
Glucose, Bld: 96 mg/dL (ref 70–99)
Total Bilirubin: 0.6 mg/dL (ref 0.3–1.2)

## 2013-04-03 LAB — PROTIME-INR
INR: 0.99 (ref 0.00–1.49)
Prothrombin Time: 13 seconds (ref 11.6–15.2)

## 2013-04-04 MED ORDER — POVIDONE-IODINE 7.5 % EX SOLN: Freq: Once | CUTANEOUS | Status: DC

## 2013-04-05 ENCOUNTER — Encounter (HOSPITAL_COMMUNITY): Payer: Self-pay | Admitting: *Deleted

## 2013-04-05 ENCOUNTER — Encounter (HOSPITAL_COMMUNITY)
Admission: RE | Disposition: A | Discharge status: Home / Self Care | Payer: Self-pay | Source: Ambulatory Visit | Attending: Orthopedic Surgery

## 2013-04-05 ENCOUNTER — Ambulatory Visit (HOSPITAL_COMMUNITY): Payer: BC Managed Care – PPO | Admitting: Anesthesiology

## 2013-04-05 ENCOUNTER — Encounter (HOSPITAL_COMMUNITY): Payer: Self-pay | Admitting: Anesthesiology

## 2013-04-05 ENCOUNTER — Ambulatory Visit (HOSPITAL_COMMUNITY): Payer: BC Managed Care – PPO

## 2013-04-05 ENCOUNTER — Inpatient Hospital Stay (HOSPITAL_COMMUNITY)
Admission: RE | Admit: 2013-04-05 | Discharge: 2013-04-06 | DRG: 865 | Disposition: A | Discharge status: Home / Self Care | Payer: BC Managed Care – PPO | Source: Ambulatory Visit | Attending: Orthopedic Surgery | Admitting: Orthopedic Surgery

## 2013-04-05 DIAGNOSIS — M5030 Other cervical disc degeneration, unspecified cervical region: Principal | ICD-10-CM | POA: Diagnosis present

## 2013-04-05 HISTORY — PX: ANTERIOR CERVICAL DECOMP/DISCECTOMY FUSION: SHX1161

## 2013-04-05 SURGERY — ANTERIOR CERVICAL DECOMPRESSION/DISCECTOMY FUSION 3 LEVELS
Anesthesia: General | Site: Neck | Wound class: Clean

## 2013-04-05 MED ORDER — ROCURONIUM BROMIDE 100 MG/10ML IV SOLN
INTRAVENOUS | Status: DC | PRN
  Administered 2013-04-05 (×3): 10 mg via INTRAVENOUS
  Administered 2013-04-05: 50 mg via INTRAVENOUS
  Administered 2013-04-05: 10 mg via INTRAVENOUS

## 2013-04-05 MED ORDER — FLEET ENEMA 7-19 GM/118ML RE ENEM: 1.0000 | ENEMA | Freq: Once | RECTAL | Status: AC | PRN

## 2013-04-05 MED ORDER — DOCUSATE SODIUM 100 MG PO CAPS
100.0000 mg | ORAL_CAPSULE | Freq: Two times a day (BID) | ORAL | Status: DC
  Administered 2013-04-05: 100 mg via ORAL
  Filled 2013-04-05 (×3): qty 1

## 2013-04-05 MED ORDER — BISACODYL 5 MG PO TBEC: 5.0000 mg | DELAYED_RELEASE_TABLET | Freq: Every day | ORAL | Status: DC | PRN

## 2013-04-05 MED ORDER — LACTATED RINGERS IV SOLN
Freq: Once | INTRAVENOUS | Status: AC
  Administered 2013-04-05: 13:00:00 via INTRAVENOUS

## 2013-04-05 MED ORDER — LACTATED RINGERS IV SOLN
INTRAVENOUS | Status: DC | PRN
  Administered 2013-04-05: 14:00:00 via INTRAVENOUS

## 2013-04-05 MED ORDER — DEXAMETHASONE SODIUM PHOSPHATE 4 MG/ML IJ SOLN
INTRAMUSCULAR | Status: DC | PRN
  Administered 2013-04-05: 10 mg via INTRAVENOUS

## 2013-04-05 MED ORDER — MENTHOL 3 MG MT LOZG: 1.0000 | LOZENGE | OROMUCOSAL | Status: DC | PRN

## 2013-04-05 MED ORDER — MORPHINE SULFATE 2 MG/ML IJ SOLN
1.0000 mg | INTRAMUSCULAR | Status: DC | PRN
  Administered 2013-04-05 – 2013-04-06 (×3): 2 mg via INTRAVENOUS
  Filled 2013-04-05 (×3): qty 1

## 2013-04-05 MED ORDER — ACETAMINOPHEN 650 MG RE SUPP: 650.0000 mg | RECTAL | Status: DC | PRN

## 2013-04-05 MED ORDER — CEFAZOLIN SODIUM 1-5 GM-% IV SOLN
1.0000 g | Freq: Three times a day (TID) | INTRAVENOUS | Status: AC
  Administered 2013-04-05 – 2013-04-06 (×2): 1 g via INTRAVENOUS
  Filled 2013-04-05 (×2): qty 50

## 2013-04-05 MED ORDER — ACETAMINOPHEN 325 MG PO TABS: 650.0000 mg | ORAL_TABLET | ORAL | Status: DC | PRN

## 2013-04-05 MED ORDER — ONDANSETRON HCL 4 MG/2ML IJ SOLN: 4.0000 mg | INTRAMUSCULAR | Status: DC | PRN

## 2013-04-05 MED ORDER — LIDOCAINE HCL (CARDIAC) 20 MG/ML IV SOLN
INTRAVENOUS | Status: DC | PRN
  Administered 2013-04-05: 80 mg via INTRAVENOUS

## 2013-04-05 MED ORDER — PROMETHAZINE HCL 25 MG/ML IJ SOLN: 6.2500 mg | INTRAMUSCULAR | Status: DC | PRN

## 2013-04-05 MED ORDER — OXYCODONE-ACETAMINOPHEN 5-325 MG PO TABS
ORAL_TABLET | ORAL | Status: AC
  Filled 2013-04-05: qty 2

## 2013-04-05 MED ORDER — BUPIVACAINE-EPINEPHRINE PF 0.25-1:200000 % IJ SOLN
INTRAMUSCULAR | Status: AC
  Filled 2013-04-05: qty 30

## 2013-04-05 MED ORDER — SENNOSIDES-DOCUSATE SODIUM 8.6-50 MG PO TABS: 1.0000 | ORAL_TABLET | Freq: Every evening | ORAL | Status: DC | PRN

## 2013-04-05 MED ORDER — LACTATED RINGERS IV SOLN
INTRAVENOUS | Status: DC | PRN
  Administered 2013-04-05 (×2): via INTRAVENOUS

## 2013-04-05 MED ORDER — PROPOFOL 10 MG/ML IV BOLUS
INTRAVENOUS | Status: DC | PRN
  Administered 2013-04-05: 200 mg via INTRAVENOUS

## 2013-04-05 MED ORDER — HYDROMORPHONE HCL PF 1 MG/ML IJ SOLN
INTRAMUSCULAR | Status: AC
  Filled 2013-04-05: qty 1

## 2013-04-05 MED ORDER — THROMBIN 20000 UNITS EX SOLR
CUTANEOUS | Status: DC | PRN
  Administered 2013-04-05: 15:00:00 via TOPICAL

## 2013-04-05 MED ORDER — SODIUM CHLORIDE 0.9 % IJ SOLN: 3.0000 mL | Freq: Two times a day (BID) | INTRAMUSCULAR | Status: DC

## 2013-04-05 MED ORDER — DIAZEPAM 5 MG PO TABS
5.0000 mg | ORAL_TABLET | Freq: Four times a day (QID) | ORAL | Status: DC | PRN
  Administered 2013-04-05: 5 mg via ORAL

## 2013-04-05 MED ORDER — NEOSTIGMINE METHYLSULFATE 1 MG/ML IJ SOLN
INTRAMUSCULAR | Status: DC | PRN
  Administered 2013-04-05: 4 mg via INTRAVENOUS

## 2013-04-05 MED ORDER — FENTANYL CITRATE 0.05 MG/ML IJ SOLN
INTRAMUSCULAR | Status: DC | PRN
  Administered 2013-04-05 (×13): 50 ug via INTRAVENOUS
  Administered 2013-04-05: 100 ug via INTRAVENOUS

## 2013-04-05 MED ORDER — DIAZEPAM 5 MG PO TABS
ORAL_TABLET | ORAL | Status: AC
  Filled 2013-04-05: qty 1

## 2013-04-05 MED ORDER — OXYCODONE HCL 5 MG PO TABS: 5.0000 mg | ORAL_TABLET | Freq: Once | ORAL | Status: DC | PRN

## 2013-04-05 MED ORDER — SODIUM CHLORIDE 0.9 % IV SOLN: 250.0000 mL | INTRAVENOUS | Status: DC

## 2013-04-05 MED ORDER — GLYCOPYRROLATE 0.2 MG/ML IJ SOLN
INTRAMUSCULAR | Status: DC | PRN
  Administered 2013-04-05: 0.6 mg via INTRAVENOUS

## 2013-04-05 MED ORDER — PHENOL 1.4 % MT LIQD: 1.0000 | OROMUCOSAL | Status: DC | PRN

## 2013-04-05 MED ORDER — VANCOMYCIN HCL IN DEXTROSE 1-5 GM/200ML-% IV SOLN
INTRAVENOUS | Status: AC
  Administered 2013-04-05: 1000 mg via INTRAVENOUS
  Filled 2013-04-05: qty 200

## 2013-04-05 MED ORDER — ALUM & MAG HYDROXIDE-SIMETH 200-200-20 MG/5ML PO SUSP: 30.0000 mL | Freq: Four times a day (QID) | ORAL | Status: DC | PRN

## 2013-04-05 MED ORDER — ATORVASTATIN CALCIUM 10 MG PO TABS
10.0000 mg | ORAL_TABLET | Freq: Every day | ORAL | Status: DC
  Filled 2013-04-05: qty 1

## 2013-04-05 MED ORDER — HYDROMORPHONE HCL PF 1 MG/ML IJ SOLN
0.2500 mg | INTRAMUSCULAR | Status: DC | PRN
  Administered 2013-04-05: 0.5 mg via INTRAVENOUS

## 2013-04-05 MED ORDER — OXYCODONE HCL 5 MG/5ML PO SOLN: 5.0000 mg | Freq: Once | ORAL | Status: DC | PRN

## 2013-04-05 MED ORDER — 0.9 % SODIUM CHLORIDE (POUR BTL) OPTIME
TOPICAL | Status: DC | PRN
  Administered 2013-04-05: 1000 mL

## 2013-04-05 MED ORDER — THROMBIN 20000 UNITS EX SOLR
CUTANEOUS | Status: AC
  Filled 2013-04-05: qty 20000

## 2013-04-05 MED ORDER — MINERAL OIL LIGHT 100 % EX OIL
TOPICAL_OIL | CUTANEOUS | Status: AC
  Filled 2013-04-05: qty 25

## 2013-04-05 MED ORDER — BUPIVACAINE-EPINEPHRINE 0.25% -1:200000 IJ SOLN
INTRAMUSCULAR | Status: DC | PRN
  Administered 2013-04-05: 10 mL

## 2013-04-05 MED ORDER — SODIUM CHLORIDE 0.9 % IJ SOLN: 3.0000 mL | INTRAMUSCULAR | Status: DC | PRN

## 2013-04-05 MED ORDER — ONDANSETRON HCL 4 MG/2ML IJ SOLN
INTRAMUSCULAR | Status: DC | PRN
  Administered 2013-04-05: 4 mg via INTRAVENOUS

## 2013-04-05 MED ORDER — OXYCODONE-ACETAMINOPHEN 5-325 MG PO TABS
1.0000 | ORAL_TABLET | ORAL | Status: DC | PRN
  Administered 2013-04-05 (×2): 2 via ORAL
  Administered 2013-04-06: 1 via ORAL
  Filled 2013-04-05 (×2): qty 2

## 2013-04-05 MED ORDER — MIDAZOLAM HCL 5 MG/5ML IJ SOLN
INTRAMUSCULAR | Status: DC | PRN
  Administered 2013-04-05: 2 mg via INTRAVENOUS

## 2013-04-05 MED ORDER — ZOLPIDEM TARTRATE 5 MG PO TABS: 5.0000 mg | ORAL_TABLET | Freq: Every evening | ORAL | Status: DC | PRN

## 2013-04-05 SURGICAL SUPPLY — 77 items
ACIS DISTRACTION PIN 12MM ×2 IMPLANT
APL SKNCLS STERI-STRIP NONHPOA (GAUZE/BANDAGES/DRESSINGS) ×1
BENZOIN TINCTURE PRP APPL 2/3 (GAUZE/BANDAGES/DRESSINGS) ×2 IMPLANT
BIT DRILL NEURO 2X3.1 SFT TUCH (MISCELLANEOUS) ×1 IMPLANT
BIT DRILL SKYLINE 12MM (BIT) IMPLANT
BLADE SURG 15 STRL LF DISP TIS (BLADE) ×1 IMPLANT
BLADE SURG 15 STRL SS (BLADE) ×2
BLADE SURG ROTATE 9660 (MISCELLANEOUS) ×2 IMPLANT
BUR MATCHSTICK NEURO 3.0 LAGG (BURR) IMPLANT
CARTRIDGE OIL MAESTRO DRILL (MISCELLANEOUS) ×1 IMPLANT
CLOTH BEACON ORANGE TIMEOUT ST (SAFETY) ×2 IMPLANT
CORDS BIPOLAR (ELECTRODE) ×2 IMPLANT
COVER SURGICAL LIGHT HANDLE (MISCELLANEOUS) ×2 IMPLANT
CRADLE DONUT ADULT HEAD (MISCELLANEOUS) ×2 IMPLANT
DEVICE ENDSKLTN IMPLNT MED 6MM (Orthopedic Implant) IMPLANT
DIFFUSER DRILL AIR PNEUMATIC (MISCELLANEOUS) ×2 IMPLANT
DRAIN JACKSON RD 7FR 3/32 (WOUND CARE) IMPLANT
DRAPE C-ARM 42X72 X-RAY (DRAPES) ×2 IMPLANT
DRAPE POUCH INSTRU U-SHP 10X18 (DRAPES) ×2 IMPLANT
DRAPE SURG 17X23 STRL (DRAPES) ×6 IMPLANT
DRILL BIT SKYLINE 12MM (BIT) ×2
DRILL NEURO 2X3.1 SOFT TOUCH (MISCELLANEOUS) ×2
DURAPREP 26ML APPLICATOR (WOUND CARE) ×2 IMPLANT
ELECT COATED BLADE 2.86 ST (ELECTRODE) ×2 IMPLANT
ELECT REM PT RETURN 9FT ADLT (ELECTROSURGICAL) ×2
ELECTRODE REM PT RTRN 9FT ADLT (ELECTROSURGICAL) ×1 IMPLANT
ENDOSKELETON IMPLANT MED 6MM (Orthopedic Implant) ×4 IMPLANT
EVACUATOR SILICONE 100CC (DRAIN) IMPLANT
GAUZE SPONGE 4X4 16PLY XRAY LF (GAUZE/BANDAGES/DRESSINGS) ×2 IMPLANT
GLOVE BIO SURGEON STRL SZ7 (GLOVE) ×2 IMPLANT
GLOVE BIO SURGEON STRL SZ8 (GLOVE) ×2 IMPLANT
GLOVE BIOGEL PI IND STRL 7.0 (GLOVE) ×2 IMPLANT
GLOVE BIOGEL PI IND STRL 8 (GLOVE) ×1 IMPLANT
GLOVE BIOGEL PI INDICATOR 7.0 (GLOVE) ×2
GLOVE BIOGEL PI INDICATOR 8 (GLOVE) ×1
GOWN STRL NON-REIN LRG LVL3 (GOWN DISPOSABLE) ×2 IMPLANT
GOWN STRL REIN XL XLG (GOWN DISPOSABLE) ×2 IMPLANT
INTERLOCK LRDTC CRVCL VBR 6MM (Bone Implant) IMPLANT
IV CATH 14GX2 1/4 (CATHETERS) ×2 IMPLANT
KIT BASIN OR (CUSTOM PROCEDURE TRAY) ×2 IMPLANT
KIT ROOM TURNOVER OR (KITS) ×2 IMPLANT
LORDOTIC CERVICAL VBR 6MM SM (Bone Implant) ×2 IMPLANT
MANIFOLD NEPTUNE II (INSTRUMENTS) ×2 IMPLANT
NDL SPNL 20GX3.5 QUINCKE YW (NEEDLE) ×1 IMPLANT
NEEDLE 27GAX1X1/2 (NEEDLE) ×2 IMPLANT
NEEDLE SPNL 20GX3.5 QUINCKE YW (NEEDLE) ×4 IMPLANT
NS IRRIG 1000ML POUR BTL (IV SOLUTION) ×2 IMPLANT
OIL CARTRIDGE MAESTRO DRILL (MISCELLANEOUS) ×2
PACK ORTHO CERVICAL (CUSTOM PROCEDURE TRAY) ×2 IMPLANT
PAD ARMBOARD 7.5X6 YLW CONV (MISCELLANEOUS) ×4 IMPLANT
PATTIES SURGICAL .5 X.5 (GAUZE/BANDAGES/DRESSINGS) ×1 IMPLANT
PATTIES SURGICAL .5 X1 (DISPOSABLE) ×1 IMPLANT
PIN DISTRACTION 12MM (PIN) ×4
PIN DSTRCT 12XNS SS ACIS (PIN) IMPLANT
PIN TEMP SKYLINE THREADED (PIN) ×1 IMPLANT
PLATE SKYLINE 3 LVL 48MM (Plate) ×1 IMPLANT
PUTTY BONE DBX 5CC MIX (Putty) ×1 IMPLANT
SCREW SKYLINE VAR OS 14MM (Screw) ×1 IMPLANT
SCREW VAR SELF TAP SKYLINE 14M (Screw) ×7 IMPLANT
SPONGE GAUZE 4X4 12PLY (GAUZE/BANDAGES/DRESSINGS) ×2 IMPLANT
SPONGE INTESTINAL PEANUT (DISPOSABLE) ×3 IMPLANT
SPONGE SURGIFOAM ABS GEL 100 (HEMOSTASIS) ×1 IMPLANT
STRIP CLOSURE SKIN 1/2X4 (GAUZE/BANDAGES/DRESSINGS) ×2 IMPLANT
SURGIFLO TRUKIT (HEMOSTASIS) IMPLANT
SUT MNCRL AB 4-0 PS2 18 (SUTURE) ×1 IMPLANT
SUT SILK 4 0 (SUTURE)
SUT SILK 4-0 18XBRD TIE 12 (SUTURE) IMPLANT
SUT VIC AB 2-0 CT2 18 VCP726D (SUTURE) ×2 IMPLANT
SYR BULB IRRIGATION 50ML (SYRINGE) ×2 IMPLANT
SYR CONTROL 10ML LL (SYRINGE) ×2 IMPLANT
TAPE CLOTH 4X10 WHT NS (GAUZE/BANDAGES/DRESSINGS) ×2 IMPLANT
TAPE UMBILICAL COTTON 1/8X30 (MISCELLANEOUS) ×4 IMPLANT
TOWEL OR 17X24 6PK STRL BLUE (TOWEL DISPOSABLE) ×2 IMPLANT
TOWEL OR 17X26 10 PK STRL BLUE (TOWEL DISPOSABLE) ×2 IMPLANT
TRAY FOLEY CATH 14FR (SET/KITS/TRAYS/PACK) ×2 IMPLANT
WATER STERILE IRR 1000ML POUR (IV SOLUTION) ×2 IMPLANT
YANKAUER SUCT BULB TIP NO VENT (SUCTIONS) ×2 IMPLANT

## 2013-04-05 NOTE — Preoperative (Signed)
Beta Blockers   Reason not to administer Beta Blockers:Not Applicable 

## 2013-04-05 NOTE — Progress Notes (Signed)
Patient sitting comfortably in bed. Arm pain is resolved. NVI. Will observe overnight and likely d/c home in the AM.

## 2013-04-05 NOTE — Anesthesia Preprocedure Evaluation (Addendum)
Anesthesia Evaluation  Patient identified by MRN, date of birth, ID band Patient awake    Reviewed: Allergy & Precautions, H&P , NPO status , Patient's Chart, lab work & pertinent test results, reviewed documented beta blocker date and time   Airway Mallampati: II TM Distance: >3 FB Neck ROM: Full    Dental  (+) Teeth Intact and Dental Advisory Given   Pulmonary neg pulmonary ROS, pneumonia -, resolved,          Cardiovascular negative cardio ROS      Neuro/Psych negative psych ROS   GI/Hepatic negative GI ROS, Neg liver ROS,   Endo/Other  negative endocrine ROS  Renal/GU negative Renal ROS     Musculoskeletal   Abdominal   Peds  Hematology   Anesthesia Other Findings   Reproductive/Obstetrics                          Anesthesia Physical Anesthesia Plan  ASA: II  Anesthesia Plan: General   Post-op Pain Management:    Induction: Intravenous  Airway Management Planned: Oral ETT  Additional Equipment:   Intra-op Plan:   Post-operative Plan: Extubation in OR  Informed Consent: I have reviewed the patients History and Physical, chart, labs and discussed the procedure including the risks, benefits and alternatives for the proposed anesthesia with the patient or authorized representative who has indicated his/her understanding and acceptance.   Dental advisory given  Plan Discussed with: CRNA, Anesthesiologist and Surgeon  Anesthesia Plan Comments:        Anesthesia Quick Evaluation

## 2013-04-05 NOTE — Anesthesia Procedure Notes (Signed)
Procedure Name: Intubation Date/Time: 04/05/2013 2:50 PM Performed by: Brien Mates DOBSON Pre-anesthesia Checklist: Patient identified, Emergency Drugs available, Suction available, Patient being monitored and Timeout performed Patient Re-evaluated:Patient Re-evaluated prior to inductionOxygen Delivery Method: Circle system utilized Preoxygenation: Pre-oxygenation with 100% oxygen Intubation Type: IV induction Ventilation: Mask ventilation without difficulty and Oral airway inserted - appropriate to patient size Laryngoscope Size: Miller and 2 Grade View: Grade I Tube type: Oral Tube size: 7.5 mm Number of attempts: 1 Airway Equipment and Method: Stylet Placement Confirmation: ETT inserted through vocal cords under direct vision,  positive ETCO2 and breath sounds checked- equal and bilateral Secured at: 22 cm Tube secured with: Tape Dental Injury: Teeth and Oropharynx as per pre-operative assessment

## 2013-04-05 NOTE — Anesthesia Postprocedure Evaluation (Signed)
  Anesthesia Post-op Note  Patient: Glenn Ortiz  Procedure(s) Performed: Procedure(s) with comments: ANTERIOR CERVICAL DECOMPRESSION/DISCECTOMY FUSION 3 LEVELS (N/A) - Anterior cervical decompression fusion cervical 4-5, cervical 5-6, cervical 6-7 with instrumentation and allograft  Patient Location: PACU  Anesthesia Type:General  Level of Consciousness: awake  Airway and Oxygen Therapy: Patient Spontanous Breathing and Patient connected to nasal cannula oxygen  Post-op Pain: mild  Post-op Assessment: Post-op Vital signs reviewed, Patient's Cardiovascular Status Stable, Respiratory Function Stable, Patent Airway and No signs of Nausea or vomiting  Post-op Vital Signs: Reviewed and stable  Complications: No apparent anesthesia complications

## 2013-04-05 NOTE — H&P (Signed)
PREOPERATIVE H&P  Chief Complaint: right arm pain  HPI: Glenn Ortiz is a 42 y.o. male who presents with 6 weeks of ongoing right arm pain and progressive weakness. Patient failed injections and other forms of conservative care. MRI revealed prominent stenosis and patient elected to proceed with surgical intervention.  Past Medical History  Diagnosis Date  . Pneumonia     hx pleural effusion 5-6 yrs ago   Past Surgical History  Procedure Laterality Date  . Hernia repair Right 88  . Knee arthroplasty Right 11  . Knee surgery Left 89/10/    x3  . Scrotum exploration  10    drain fluid   History   Social History  . Marital Status: Divorced    Spouse Name: N/A    Number of Children: N/A  . Years of Education: N/A   Social History Main Topics  . Smoking status: Never Smoker   . Smokeless tobacco: None     Comment: occ alcohol  . Alcohol Use: Yes  . Drug Use: No  . Sexually Active: None   Other Topics Concern  . None   Social History Narrative  . None   History reviewed. No pertinent family history. Allergies  Allergen Reactions  . Penicillins Nausea And Vomiting    Had as an adult ok   Prior to Admission medications   Medication Sig Start Date End Date Taking? Authorizing Provider  fish oil-omega-3 fatty acids 1000 MG capsule Take 1,000 mg by mouth 3 (three) times daily.   Yes Historical Provider, MD  Multiple Vitamin (MULTIVITAMIN) tablet Take 1 tablet by mouth daily.   Yes Historical Provider, MD  naproxen sodium (ANAPROX) 220 MG tablet Take 220 mg by mouth 2 (two) times daily with a meal.   Yes Historical Provider, MD  rosuvastatin (CRESTOR) 5 MG tablet Take 5 mg by mouth daily.   Yes Historical Provider, MD  traMADol (ULTRAM) 50 MG tablet Take 50 mg by mouth every 6 (six) hours as needed for pain.   Yes Historical Provider, MD     All other systems have been reviewed and were otherwise negative with the exception of those mentioned in the HPI and as  above.  Physical Exam: Filed Vitals:   04/05/13 0906  BP: 127/80  Pulse: 64  Temp: 97.7 F (36.5 C)  Resp: 20    General: Alert, no acute distress Cardiovascular: No pedal edema Respiratory: No cyanosis, no use of accessory musculature GI: No organomegaly, abdomen is soft and non-tender Skin: No lesions in the area of chief complaint Neurologic: Sensation intact distally, 4/5 right wrist extension, triceps, and biceps Psychiatric: Patient is competent for consent with normal mood and affect Lymphatic: No axillary or cervical lymphadenopathy  MUSCULOSKELETAL: + spurling's on right  Assessment/Plan: Cervical radiculitis Plan for Procedure(s): ANTERIOR CERVICAL DECOMPRESSION/DISCECTOMY FUSION 3 LEVELS   Emilee Hero, MD 04/05/2013 1:13 PM

## 2013-04-05 NOTE — Transfer of Care (Signed)
Immediate Anesthesia Transfer of Care Note  Patient: Glenn Ortiz  Procedure(s) Performed: Procedure(s) with comments: ANTERIOR CERVICAL DECOMPRESSION/DISCECTOMY FUSION 3 LEVELS (N/A) - Anterior cervical decompression fusion cervical 4-5, cervical 5-6, cervical 6-7 with instrumentation and allograft  Patient Location: PACU  Anesthesia Type:General  Level of Consciousness: awake  Airway & Oxygen Therapy: Patient Spontanous Breathing and Patient connected to face mask oxygen  Post-op Assessment: Report given to PACU RN and Post -op Vital signs reviewed and stable  Post vital signs: Reviewed and stable  Complications: No apparent anesthesia complications

## 2013-04-06 ENCOUNTER — Encounter (HOSPITAL_COMMUNITY): Payer: Self-pay | Admitting: Orthopedic Surgery

## 2013-04-06 NOTE — Progress Notes (Signed)
UR COMPLETED  

## 2013-04-06 NOTE — Op Note (Signed)
Glenn Ortiz, MALOOF NO.:  [id number]  MEDICAL RECORD NO.:  [id number]  LOCATION:  5N23C                        FACILITY:  [facility]  PHYSICIAN:  Estill Bamberg, MD      DATE OF BIRTH:  1971-03-07  DATE OF PROCEDURE:  04/05/2013                              OPERATIVE REPORT   PREOPERATIVE DIAGNOSES: 1. Severe right-sided cervical radiculopathy with progressive weakness     of his right arm. 2. Cervical degenerative disk disease, C4-5, C5-6, C6-7. 3. Varying degrees of neural foraminal stenoses involving C4-5, C5-6,     as well as C6-7.  POSTOPERATIVE DIAGNOSES: 1. Severe right-sided cervical radiculopathy with progressive weakness     of his right arm. 2. Cervical degenerative disk disease, C4-5, C5-6, C6-7. 3. Varying degrees of neural foraminal stenoses involving C4-5, C5-6,     as well as C6-7.  PROCEDURES: 1. Anterior cervical decompression and fusion, C4-5, C5-6, C6-7. 2. Placement of anterior instrumentation, C4-C7. 3. Insertion of interbody device x3 (6-mm tightened small lordotic     interbody spacers). 4. Use of morselized allograft. 5. Intraoperative use of fluoroscopy.  SURGEON:  Estill Bamberg, MD  ASSISTANT:  Jason Coop, [facility]  ANESTHESIA:  General endotracheal anesthesia.  COMPLICATIONS:  None.  DISPOSITION:  Stable.  ESTIMATED BLOOD LOSS:  Minimal.  INDICATIONS FOR PROCEDURE:  Briefly, Glenn Ortiz is a pleasant 42 year old male who did present to me with severe and debilitating pain in his right arm.  The patient did note progressive weakness as well.  The patient was previously evaluated by Dr. Modesto Charon.  He did have multiple forms of conservative care including physical therapy, injections, and chiropractic care, but did continue to have pain.  Given his progressive weakness and pain in the findings on his MRI, we did have a discussion regarding going forward with a three-level anterior cervical decompression and fusion.  The patient fully  understood the risks and limitations of the procedure as outlined in my preoperative note.  DESCRIPTION OF PROCEDURE:  On April 05, 2013, the patient was brought to surgery and general endotracheal anesthesia was administered.  The patient was placed supine on the hospital bed.  The neck was gently extended.  The shoulders were taped to the inferior aspect of the bed. Time-out procedure was performed and the neck was prepped and draped in the usual sterile fashion.  I then proceeded with making the left-sided transverse incision.  The plane between the sternocleidomastoid muscle laterally and the strap muscles medially was identified and explored. The carotid artery was identified and palpated and retracted laterally. The strap muscles and the esophagus was retracted medially.  The anterior cervical spine was readily identified.  I then obtained a lateral intraoperative fluoroscopic view to confirm the appropriate operative levels.  I then went forward with subperiosteal exposure of the vertebral bodies of C4, C5, C6, and C7.  I then turned my attention towards the C6-7 intervertebral space.  Of note, the omohyoid was very robust and was very much in the way of the appropriate retraction to identify the C6-7 intervertebral space.  The omohyoid was then dissected free and was transected using a Bovie.  The C6-7 intervertebral space was then  identified.  Caspar pins were placed into the C6 and C7 vertebral bodies and gentle distraction was applied across the intervertebral space.  I then used a 15-blade knife to perform an annulotomy anteriorly.  I then used a series of curettes to perform a thorough and complete diskectomy.  The posterior longitudinal ligament was then identified and entered using a nerve hook.  I then went forward with a thorough and complete neural foraminal decompression on both the right and the left sides.  Of particular note, on the right side, there was noted to be  very severe neural foraminal stenosis.  There were 2 large herniated disk fragments that were located in the neural foramen. These were removed using a #2 Kerrison.  I ultimately was then able to pass a nerve hook out the neural foramen on the right side.  This did confirm thorough and adequate decompression.  I then prepared the endplates and I did feel that a 6-mm lordotic spacer would be the most appropriate fit.  This was packed with DBX mix and tamped into position in the usual fashion under distraction.  Distraction was then discontinued and I then proceeded with removing the Caspar pin from the C7 vertebral body.  Bone wax was placed in its place.  I then turned my attention towards the C5-6 interspace.  There were extensive osteophytes noted anteriorly and these were removed using a rongeur.  I then placed a Caspar pin in the C5 vertebral body and distraction was applied.  I then went forward with a diskectomy in the manner described previously. Once again, the posterior longitudinal ligament was entered and a thorough neural foraminal decompression was performed on the right and the left sides.  I again prepared the endplates and placed a series of trials.  I did feel that a 6-mm medium interbody spacer would be the most appropriate fit.  This was packed with the DBX mix and tamped into the appropriate position in the usual fashion.  The Caspar pin from the C6 vertebral body was removed and then the bone wax was placed.  It was then replaced into the C3-4 vertebral body and distraction was applied. A diskectomy was performed in the manner previously described.  Once again, a thorough neural foraminal decompression was performed on both the right and the left sides.  Of note, there was significant concavity of the upper endplate at this level.  I did even out the concavity by liberally using a Kerrison punch.  There was also significant slope of the uncovertebral joints on the  right and the left sides.  This was minimized using a curette.  I then placed a series of trials and I did feel that a small interbody spacer be the most appropriate fit.  Once again, this was packed with DBX mix and tamped into position.  The Caspar pins were then removed and the bone wax was placed in their place.  I then chose an appropriately sized Skyline anterior cervical plate.  The plate was provisionally placed over the anterior cervical spine and a temporary pin was placed to hold the plate provisionally into position.  I then placed a total of 8 self-tapping variable angle screws, 2 in each vertebral body from C4 to C7.  I was very pleased with the purchase of the screws.  I did use AP and lateral fluoroscopy while placing the plate and the hardware to confirm appropriate positioning. I was very pleased with the final construct noted.  At this point,  I copiously irrigated the wound.  I then explored the wound for any undue bleeding.  There were some minor areas of bleeding which were controlled using bipolar electrocautery.  At this point, I did close the platysma using 2-0 Vicryl.  The skin was then closed using 3-0 Monocryl.  Benzoin and Steri-Strips were then applied followed by sterile dressing.  All instrument counts were correct at the termination of the procedure.  Of note, Jason Coop was my assistant throughout the entirety of the procedure, and did aid in essential retraction and suctioning needed throughout the surgery.     Estill Bamberg, MD     MD/MEDQ  D:  04/05/2013  T:  04/06/2013  Job:  [id number]

## 2013-04-06 NOTE — Progress Notes (Signed)
PO day 1 S/P C4-7 ACDF. Doing very well, no R arm pain, some residual numbness in R radial 3 fingers, mild posterior neck pain, mild hoarseness of voice, CC is discomfort and annoyance with IV in R hand.  BP 134/84  Pulse 110  Temp(Src) 98.9 F (37.2 C) (Oral)  Resp 18  SpO2 96% Pt laying comfortably in hospital bed having finished breakfast. Dressing CDI, Hard Collar in place, mildly loose, repositioned appropriately, 2+ DRP, neurovascularly intact.  PO day 1 S/P C4-7 ACDF, resolved R Radiculopathy pain, mild neck pain  -Pain minimal and well controlled on oral meds   -D/C on Percocet/Valium, written scripts in chart  -Hard Collar: repositioned/tightened and discussed importance  -D/C home today  -F/U in office 2wks @scheduled  appt  -NO lifting over 10lbs  -Philly collar to bedside for showering

## 2013-04-16 NOTE — Discharge Summary (Signed)
Patient ID: PAL SHELL MRN: [id number] DOB/AGE: [birth year] 42 y.o.  Admit date: 04/05/2013 Discharge date: 04/06/2013  Admission Diagnoses: R Radiculopathy   Discharge Diagnoses:  Same  Past Medical History  Diagnosis Date  . Pneumonia     hx pleural effusion 5-6 yrs ago    Surgeries: Procedure(s): ANTERIOR CERVICAL DECOMPRESSION/DISCECTOMY FUSION 3 LEVELS C4-7 ACDF on 04/05/2013   Discharged Condition: Improved  Hospital Course: Glenn Ortiz is an 42 y.o. male who was admitted 04/05/2013 for operative treatment of Right radiculopathy. Patient has severe unremitting pain that affects sleep, daily activities, and work/hobbies. After pre-op clearance the patient was taken to the operating room on 04/05/2013 and underwent  Procedure(s): ANTERIOR CERVICAL DECOMPRESSION/DISCECTOMY FUSION 3 LEVELC4-7 ACDF.    Patient was given perioperative antibiotics:  Anti-infectives   Start     Dose/Rate Route Frequency Ordered Stop   04/06/13 0000  ceFAZolin (ANCEF) IVPB 1 g/50 mL premix     1 g 100 mL/hr over 30 Minutes Intravenous Every 8 hours 04/05/13 2040 04/06/13 0753   04/05/13 1414  vancomycin (VANCOCIN) 1 GM/200ML IVPB    Comments:  MAHONY, REBECCA: cabinet override      04/05/13 1414 04/05/13 1429       Patient was given sequential compression devices, early ambulation, and chemoprophylaxis to prevent DVT.  Patient benefited maximally from hospital stay and there were no complications.    Recent vital signs: BP 134/84  Pulse 110  Temp(Src) 98.9 F (37.2 C) (Oral)  Resp 18  SpO2 96%  Discharge Medications:     Medication List    STOP taking these medications       fish oil-omega-3 fatty acids 1000 MG capsule     naproxen sodium 220 MG tablet  Commonly known as:  ANAPROX     traMADol 50 MG tablet  Commonly known as:  ULTRAM      TAKE these medications       multivitamin tablet  Take 1 tablet by mouth daily.     rosuvastatin 5 MG tablet  Commonly known as:   CRESTOR  Take 5 mg by mouth daily.        Diagnostic Studies: Dg Chest 2 View  04/03/2013   *RADIOLOGY REPORT*  Clinical Data: Preop for anterior cervical spine fusion  CHEST - 2 VIEW  Comparison: Chest x-ray of 06/30/2012  Findings: Mild volume loss is noted at the left lung base.  No focal infiltrate or effusion is seen.  The heart is within upper limits of normal and stable.  No acute bony abnormality is seen.  IMPRESSION: Minimal left basilar volume loss.  No active lung disease.   Original Report Authenticated By: Dwyane Dee, M.D.   Dg Cervical Spine 1 View  04/05/2013   *RADIOLOGY REPORT*  Clinical Data: Cervical spine degenerative changes.  Fusion.  DG CERVICAL SPINE - 1 VIEW  Comparison: Cervical spine MR dated 03/12/2013.  Findings: A single lateral C-arm view of the cervical spine demonstrates interval interbody spacer and anterior screw and plate fusion beginning at the C4 level and extending inferiorly.  The inferior portions are obscured by the patient's shoulders.  IMPRESSION: Operative changes, as described above.  Routine views are recommended when possible.   Original Report Authenticated By: Beckie Salts, M.D.   Dg C-arm Gt 120 Min  04/05/2013   *RADIOLOGY REPORT*  Clinical Data: Cervical spine degenerative changes.  Fusion.  DG CERVICAL SPINE - 1 VIEW  Comparison: Cervical spine MR dated 03/12/2013.  Findings: A  single lateral C-arm view of the cervical spine demonstrates interval interbody spacer and anterior screw and plate fusion beginning at the C4 level and extending inferiorly.  The inferior portions are obscured by the patient's shoulders.  IMPRESSION: Operative changes, as described above.  Routine views are recommended when possible.   Original Report Authenticated By: Beckie Salts, M.D.    Disposition: 01-Home or Self Care   PO day 1 S/P C4-7 ACDF, resolved R Radiculopathy pain, mild neck pain  -Pain minimal and well controlled on oral meds  -D/C on Percocet/Valium,  written scripts in chart  -Hard Collar: repositioned/tightened and discussed importance  -D/C home today  -F/U in office 2wks @scheduled  appt  -NO lifting over 10lbs  -Philly collar to bedside for showering         Signed: Georga Bora 04/16/2013, 4:54 PM

## 2014-01-11 ENCOUNTER — Telehealth: Payer: Self-pay

## 2014-01-11 ENCOUNTER — Ambulatory Visit (INDEPENDENT_AMBULATORY_CARE_PROVIDER_SITE_OTHER)
Admission: RE | Admit: 2014-01-11 | Discharge: 2014-01-11 | Disposition: A | Discharge status: Home / Self Care | Payer: BC Managed Care – PPO | Source: Ambulatory Visit | Attending: Internal Medicine | Admitting: Internal Medicine

## 2014-01-11 ENCOUNTER — Encounter: Payer: Self-pay | Admitting: Internal Medicine

## 2014-01-11 ENCOUNTER — Ambulatory Visit (INDEPENDENT_AMBULATORY_CARE_PROVIDER_SITE_OTHER): Payer: BC Managed Care – PPO | Admitting: Internal Medicine

## 2014-01-11 VITALS — BP 132/88 | HR 80 | Ht 69.5 in | Wt 215.0 lb

## 2014-01-11 DIAGNOSIS — R05 Cough: Secondary | ICD-10-CM

## 2014-01-11 DIAGNOSIS — J189 Pneumonia, unspecified organism: Secondary | ICD-10-CM | POA: Insufficient documentation

## 2014-01-11 DIAGNOSIS — R059 Cough, unspecified: Secondary | ICD-10-CM

## 2014-01-11 DIAGNOSIS — R058 Other specified cough: Secondary | ICD-10-CM

## 2014-01-11 MED ORDER — PREDNISONE 10 MG PO TABS: ORAL_TABLET | ORAL | Status: DC

## 2014-01-11 MED ORDER — TRAMADOL HCL 50 MG PO TABS: ORAL_TABLET | ORAL | Status: DC

## 2014-01-11 MED ORDER — PANTOPRAZOLE SODIUM 40 MG PO TBEC: 40.0000 mg | DELAYED_RELEASE_TABLET | Freq: Every day | ORAL | Status: DC

## 2014-01-11 MED ORDER — DOXYCYCLINE HYCLATE 100 MG PO TABS: 100.0000 mg | ORAL_TABLET | Freq: Two times a day (BID) | ORAL | Status: DC

## 2014-01-11 MED ORDER — FAMOTIDINE 20 MG PO TABS: ORAL_TABLET | ORAL | Status: AC

## 2014-01-11 NOTE — Assessment & Plan Note (Addendum)
Not convinced this is realted to 8 year h/o cough (which is more c/w uacs)  And not as likely  pna in absence of fever, purulent sputum but there is def as opacity LLL post basal segment on pa, not well seen on lateral so will rx as CAP and regroup in 2 weeks  rec doxy x 10 d

## 2014-01-11 NOTE — Telephone Encounter (Signed)
Message copied by Velvet BatheAULFIELD, Riel Hirschman L on Fri Jan 11, 2014  5:27 PM ------      Message from: Sandrea HughsWERT, MICHAEL B      Created: Fri Jan 11, 2014  5:24 PM       Call pt:  Reviewed cxr and may have early pna on L so rx doxy x 10 days then ov with cxr in 2 weeks ------

## 2014-01-11 NOTE — Progress Notes (Signed)
   Subjective:    Patient ID: Glenn Ortiz, male    DOB: Jan 24, 1971   MRN: [id number]  HPI   [age] yowm never smoker with daily cough since admission 20008 with ? pna / effusion requiring vats and worse since late Feb 2015.   01/11/2014 1st [place] [facility] office visit/ Wert cc dry cough daily x 7 years first thing in am and never sob when not coughing but now  much worse x 3 weeks sob  with fever took home abx but then pain on L side and worse doe. Seen by Dr Kevan NyGates rx pantoprazole p supper.  Worse p stirs in am but doesn't wake him up.   No obvious other patterns in day to day or daytime variabilty or assoc     chest tightness, subjective wheeze overt sinus or hb symptoms. No unusual exp hx or h/o childhood pna/ asthma or knowledge of premature birth.  Sleeping ok without nocturnal  or early am exacerbation  of respiratory  c/o's or need for noct saba. Also denies any obvious fluctuation of symptoms with weather or environmental changes or other aggravating or alleviating factors except as outlined above   Current Medications, Allergies, Complete Past Medical History, Past Surgical History, Family History, and Social History were reviewed in [facility] electronic medical record.             Review of Systems  Constitutional: Negative for fever and unexpected weight change.  HENT: Negative for congestion, dental problem, ear pain, nosebleeds, postnasal drip, rhinorrhea, sinus pressure, sneezing, sore throat and trouble swallowing.   Eyes: Negative for redness and itching.  Respiratory: Positive for cough and shortness of breath. Negative for chest tightness and wheezing.   Cardiovascular: Negative for palpitations and leg swelling.  Gastrointestinal: Negative for nausea and vomiting.  Genitourinary: Negative for dysuria.  Musculoskeletal: Negative for joint swelling.  Skin: Negative for rash.  Neurological: Positive for headaches.  Hematological: Does not bruise/bleed easily.   Psychiatric/Behavioral: Negative for dysphoric mood. The patient is not nervous/anxious.        Objective:   Physical Exam  Wt Readings from Last 3 Encounters:  01/11/14 215 lb (97.523 kg)  04/03/13 213 lb 10 oz (96.9 kg)     HEENT: nl dentition, turbinates, and orophanx. Nl external ear canals without cough reflex   NECK :  without JVD/Nodes/TM/ nl carotid upstrokes bilaterally   LUNGS: no acc muscle use, clear to A and P bilaterally without cough on insp or exp maneuvers   CV:  RRR  no s3 or murmur or increase in P2, no edema   ABD:  soft and nontender with nl excursion in the supine position. No bruits or organomegaly, bowel sounds nl  MS:  warm without deformities, calf tenderness, cyanosis or clubbing  SKIN: warm and dry without lesions    NEURO:  alert, approp, no deficits    CXR  01/11/2014 : Airspace opacity in the left lung base/left lower lobe concerning for pneumonia.  Low lung volumes.        Assessment & Plan:

## 2014-01-11 NOTE — Telephone Encounter (Signed)
Pt aware of results and recs.  cxr order has been placed for 3/27 appt.  Nothing further needed.

## 2014-01-11 NOTE — Assessment & Plan Note (Signed)

## 2014-01-11 NOTE — Patient Instructions (Addendum)
Prednisone 10 mg take  4 each am x 2 days,   2 each am x 2 days,  1 each am x 2 days and stop  The key to effective treatment for your cough is eliminating the non-stop cycle of cough you're stuck in long enough to let your airway heal completely and then see if there is anything still making you cough once you stop the cough suppression, but this should take no more than 5 days to figure out  First take delsym two tsp every 12 hours and supplement if needed with  tramadol 50 mg up to 2 every 4 hours to suppress the urge to cough at all or even clear your throat. Swallowing water or using ice chips/non mint and menthol containing candies (such as lifesavers or sugarless jolly ranchers) are also effective.  You should rest your voice and avoid activities that you know make you cough.  Once you have eliminated the cough for 3 straight days try reducing the tramadol first,  then the delsym as tolerated.    Try pantoprazole  Take 30-60 min before first meal of the day and Pepcid ac 20 mg one bedtime   GERD (REFLUX)  is an extremely common cause of respiratory symptoms, many times with no significant heartburn at all.    It can be treated with medication, but also with lifestyle changes including avoidance of late meals, excessive alcohol, smoking cessation, and avoid fatty foods, chocolate, peppermint, colas, red wine, and acidic juices such as orange juice.  NO MINT OR MENTHOL PRODUCTS SO NO COUGH DROPS  USE SUGARLESS CANDY INSTEAD (jolley ranchers or Stover's lifesavers  NO OIL BASED VITAMINS - use powdered substitutes.  Please remember to go to the   x-ray department downstairs for your tests - we will call you with the results when they are available. Late add doxy x 10d for ? pna and f/u in 2 weeks

## 2014-01-25 ENCOUNTER — Encounter: Payer: Self-pay | Admitting: Internal Medicine

## 2014-01-25 ENCOUNTER — Ambulatory Visit (INDEPENDENT_AMBULATORY_CARE_PROVIDER_SITE_OTHER): Payer: BC Managed Care – PPO | Admitting: Internal Medicine

## 2014-01-25 VITALS — BP 110/80 | HR 62 | Temp 98.4°F | Ht 69.0 in | Wt 217.4 lb

## 2014-01-25 DIAGNOSIS — J189 Pneumonia, unspecified organism: Secondary | ICD-10-CM

## 2014-01-25 DIAGNOSIS — R058 Other specified cough: Secondary | ICD-10-CM

## 2014-01-25 DIAGNOSIS — R059 Cough, unspecified: Secondary | ICD-10-CM

## 2014-01-25 DIAGNOSIS — R05 Cough: Secondary | ICD-10-CM

## 2014-01-25 NOTE — Progress Notes (Signed)
Subjective:    Patient ID: Glenn Ortiz, male    DOB: Jul 17, 1971   MRN: [id number]    Brief patient profile:  [age] yowm never smoker with daily cough since admission 2008 with ? pna / effusion requiring vats and worse since late Feb 2015.   History of Present Illness  01/11/2014 1st [place] [facility] office visit/ Glenn Ortiz cc dry cough daily x 7 years first thing in am and never sob when not coughing but now  much worse x 3 weeks sob  with fever took home abx but then pain on L side and worse doe. Seen by Dr Glenn Ortiz rx pantoprazole p supper.worse p stirs in am but doesn't wake him up rec Prednisone 10 mg take  4 each am x 2 days,   2 each am x 2 days,  1 each am x 2 days and stop First take delsym two tsp every 12 hours and supplement if needed with  tramadol 50 mg up to 2 every 4 hours to suppress the urge to cough at all or even clear your throat.   Try pantoprazole  Take 30-60 min before first meal of the day and Pepcid ac 20 mg one bedtime  GERD diet Late add doxy x 10d for ? pna and f/u in 2 weeks    01/25/2014 f/u ov/Glenn Ortiz re: chronic cough since 2008 Chief Complaint  Patient presents with  . Follow-up    Pt states cough and SOB much improved since last visit. No new co's today.   pain on L / pressure L similar to prev parapneumonic process, resolved as did  Fever went with abx.Cough is back to baseline, dry daytime daily since, with poor activity tol due to sob, eg climbing steps or getting in a hurry, no real change since 2008     Glenn Ortiz Reflux v Neurogenic Cough Differentiator Reflux Comments  Do you awaken from a sound sleep coughing violently?                            With trouble breathing? no   Do you have choking episodes when you cannot  Get enough air, gasping for air ?              Not now   Do you usually cough when you lie down into  The bed, or when you just lie down to rest ?                          sometimes   Do you usually cough after meals or eating?          Yes   Do you cough when (or after) you bend over?    sometimes   GERD SCORE     Glenn Ortiz Reflux v Neurogenic Cough Differentiator Neurogenic   Do you more-or-less cough all day long? no   Does change of temperature make you cough? no   Does laughing or chuckling cause you to cough? sometimes   Do fumes (perfume, automobile fumes, burned  Toast, etc.,) cause you to cough ?      no   Does speaking, singing, or talking on the phone cause you to cough   ?               no   Neurogenic/Airway score         No other obvious day to day or daytime  variabilty or assoc   cp or chest tightness, subjective wheeze overt sinus or hb symptoms. No unusual exp hx or h/o childhood pna/ asthma or knowledge of premature birth.  Sleeping ok without nocturnal  or early am exacerbation  of respiratory  c/o's or need for noct saba. Also denies any obvious fluctuation of symptoms with weather or environmental changes or other aggravating or alleviating factors except as outlined above   Current Medications, Allergies, Complete Past Medical History, Past Surgical History, Family History, and Social History were reviewed in [facility] record.  ROS  The following are not active complaints unless bolded sore throat, dysphagia, dental problems, itching, sneezing,  nasal congestion or excess/ purulent secretions, ear ache,   fever, chills, sweats, unintended wt loss, pleuritic or exertional cp, hemoptysis,  orthopnea pnd or leg swelling, presyncope, palpitations, heartburn, abdominal pain, anorexia, nausea, vomiting, diarrhea  or change in bowel or urinary habits, change in stools or urine, dysuria,hematuria,  rash, arthralgias, visual complaints, headache, numbness weakness or ataxia or problems with walking or coordination,  change in mood/affect or memory.                      Objective:   Physical Exam  . Wt Readings from Last 3 Encounters:  01/25/14 217 lb 6.4 oz (98.612 kg)   01/11/14 215 lb (97.523 kg)  04/03/13 213 lb 10 oz (96.9 kg)        HEENT: nl dentition, turbinates, and orophanx. Nl external ear canals without cough reflex   NECK :  without JVD/Nodes/TM/ nl carotid upstrokes bilaterally   LUNGS: no acc muscle use,  without cough on insp or exp maneuvers, minimal decrease bs, minimal insp crackles, LLL   CV:  RRR  no s3 or murmur or increase in P2, no edema   ABD:  soft and nontender with nl excursion in the supine position. No bruits or organomegaly, bowel sounds nl  MS:  warm without deformities, calf tenderness, cyanosis or clubbing  SKIN: warm and dry without lesions    NEURO:  alert, approp, no deficits    CXR  01/11/2014 : Airspace opacity in the left lung base/left lower lobe concerning for pneumonia.  Low lung volumes.        Assessment & Plan:

## 2014-01-25 NOTE — Patient Instructions (Addendum)
Pantoprazole (protonix) 40 mg   Take 30-60 min before first meal of the day and Pepcid 20 mg one bedtime until return to office - this is the best way to tell whether stomach acid is contributing to your problem.    See if you can find a dose of tramadol that suppresses the cough and even the urge to cough x 3 straight days  Please schedule a follow up office visit in  About 4 weeks, sooner if needed cxr and pfts on return

## 2014-01-26 NOTE — Assessment & Plan Note (Signed)
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   These are the same pts (now being labeled as having "irritable larynx syndrome" by some cough centers) who not infrequently have a history of having failed to tolerate ace inhibitors,  dry powder inhalers or biphosphonates or report having atypical reflux symptoms that don't respond to standard doses of PPI , and are easily confused as having aecopd or asthma flares by even experienced allergists/ pulmonologists.   For now max rx for gerd then regroup in 4 weeks with pfts to sort out c/o chronic doe

## 2014-01-26 NOTE — Assessment & Plan Note (Signed)
No convicing change on today's cxr vs before abx so likely just seeing scarring in LLL where prev had vats, no further abx planned

## 2014-02-04 ENCOUNTER — Institutional Professional Consult (permissible substitution): Payer: BC Managed Care – PPO | Admitting: Internal Medicine

## 2014-03-01 ENCOUNTER — Ambulatory Visit: Payer: BC Managed Care – PPO | Admitting: Internal Medicine

## 2016-02-05 DIAGNOSIS — R400 Somnolence: Secondary | ICD-10-CM | POA: Diagnosis not present

## 2016-03-01 DIAGNOSIS — G4730 Sleep apnea, unspecified: Secondary | ICD-10-CM | POA: Diagnosis not present

## 2016-03-12 DIAGNOSIS — G4733 Obstructive sleep apnea (adult) (pediatric): Secondary | ICD-10-CM | POA: Diagnosis not present

## 2016-04-12 DIAGNOSIS — G4733 Obstructive sleep apnea (adult) (pediatric): Secondary | ICD-10-CM | POA: Diagnosis not present

## 2016-05-12 DIAGNOSIS — G4733 Obstructive sleep apnea (adult) (pediatric): Secondary | ICD-10-CM | POA: Diagnosis not present

## 2016-05-27 DIAGNOSIS — G4733 Obstructive sleep apnea (adult) (pediatric): Secondary | ICD-10-CM | POA: Diagnosis not present

## 2016-06-12 DIAGNOSIS — G4733 Obstructive sleep apnea (adult) (pediatric): Secondary | ICD-10-CM | POA: Diagnosis not present

## 2016-09-28 DIAGNOSIS — G4733 Obstructive sleep apnea (adult) (pediatric): Secondary | ICD-10-CM | POA: Diagnosis not present

## 2016-12-23 ENCOUNTER — Other Ambulatory Visit: Payer: Self-pay | Admitting: Internal Medicine

## 2016-12-23 DIAGNOSIS — E559 Vitamin D deficiency, unspecified: Secondary | ICD-10-CM | POA: Diagnosis not present

## 2016-12-23 DIAGNOSIS — R5383 Other fatigue: Secondary | ICD-10-CM | POA: Diagnosis not present

## 2016-12-23 DIAGNOSIS — R079 Chest pain, unspecified: Secondary | ICD-10-CM | POA: Diagnosis not present

## 2016-12-23 DIAGNOSIS — E291 Testicular hypofunction: Secondary | ICD-10-CM | POA: Diagnosis not present

## 2016-12-23 DIAGNOSIS — G4733 Obstructive sleep apnea (adult) (pediatric): Secondary | ICD-10-CM | POA: Diagnosis not present

## 2016-12-23 DIAGNOSIS — E782 Mixed hyperlipidemia: Secondary | ICD-10-CM | POA: Diagnosis not present

## 2016-12-23 DIAGNOSIS — Z79899 Other long term (current) drug therapy: Secondary | ICD-10-CM | POA: Diagnosis not present

## 2016-12-24 ENCOUNTER — Inpatient Hospital Stay (HOSPITAL_COMMUNITY): Admission: RE | Admit: 2016-12-24 | Payer: Self-pay | Source: Ambulatory Visit

## 2016-12-28 ENCOUNTER — Telehealth (HOSPITAL_COMMUNITY): Payer: Self-pay

## 2016-12-28 NOTE — Telephone Encounter (Signed)
Duplicate Encounter

## 2016-12-28 NOTE — Telephone Encounter (Signed)
Encounter complete. 

## 2016-12-29 ENCOUNTER — Telehealth (HOSPITAL_COMMUNITY): Payer: Self-pay

## 2016-12-29 NOTE — Telephone Encounter (Signed)
No phone call made. I attempted x2 yesterday. No alternative numbers on file.

## 2016-12-29 NOTE — Telephone Encounter (Signed)
Encounter complete. 

## 2016-12-30 ENCOUNTER — Ambulatory Visit (HOSPITAL_COMMUNITY)
Admission: RE | Admit: 2016-12-30 | Discharge: 2016-12-30 | Disposition: A | Discharge status: Home / Self Care | Payer: BLUE CROSS/BLUE SHIELD | Source: Ambulatory Visit | Attending: Internal Medicine | Admitting: Internal Medicine

## 2016-12-30 DIAGNOSIS — R079 Chest pain, unspecified: Secondary | ICD-10-CM | POA: Insufficient documentation

## 2016-12-30 LAB — EXERCISE TOLERANCE TEST
CHL CUP MPHR: 174 {beats}/min
CSEPHR: 81 %
CSEPPHR: 141 {beats}/min
Estimated workload: 10.5 METS
Exercise duration (min): 9 min
Exercise duration (sec): 18 s
RPE: 18
Rest HR: 66 {beats}/min

## 2017-06-27 DIAGNOSIS — G4733 Obstructive sleep apnea (adult) (pediatric): Secondary | ICD-10-CM | POA: Diagnosis not present

## 2018-02-14 DIAGNOSIS — M1712 Unilateral primary osteoarthritis, left knee: Secondary | ICD-10-CM | POA: Diagnosis not present

## 2018-06-06 DIAGNOSIS — E559 Vitamin D deficiency, unspecified: Secondary | ICD-10-CM | POA: Diagnosis not present

## 2018-06-06 DIAGNOSIS — E782 Mixed hyperlipidemia: Secondary | ICD-10-CM | POA: Diagnosis not present

## 2018-06-06 DIAGNOSIS — Z79899 Other long term (current) drug therapy: Secondary | ICD-10-CM | POA: Diagnosis not present

## 2018-06-06 DIAGNOSIS — E291 Testicular hypofunction: Secondary | ICD-10-CM | POA: Diagnosis not present

## 2018-06-06 DIAGNOSIS — B001 Herpesviral vesicular dermatitis: Secondary | ICD-10-CM | POA: Diagnosis not present

## 2019-02-01 DIAGNOSIS — E782 Mixed hyperlipidemia: Secondary | ICD-10-CM | POA: Diagnosis not present

## 2019-02-01 DIAGNOSIS — Z79899 Other long term (current) drug therapy: Secondary | ICD-10-CM | POA: Diagnosis not present

## 2019-02-01 DIAGNOSIS — E559 Vitamin D deficiency, unspecified: Secondary | ICD-10-CM | POA: Diagnosis not present

## 2019-02-01 DIAGNOSIS — G4733 Obstructive sleep apnea (adult) (pediatric): Secondary | ICD-10-CM | POA: Diagnosis not present

## 2019-02-01 DIAGNOSIS — Z202 Contact with and (suspected) exposure to infections with a predominantly sexual mode of transmission: Secondary | ICD-10-CM | POA: Diagnosis not present

## 2019-02-01 DIAGNOSIS — E291 Testicular hypofunction: Secondary | ICD-10-CM | POA: Diagnosis not present

## 2019-02-01 DIAGNOSIS — Z0000 Encounter for general adult medical examination without abnormal findings: Secondary | ICD-10-CM | POA: Diagnosis not present

## 2019-02-05 DIAGNOSIS — Z202 Contact with and (suspected) exposure to infections with a predominantly sexual mode of transmission: Secondary | ICD-10-CM | POA: Diagnosis not present

## 2020-05-23 DIAGNOSIS — G4733 Obstructive sleep apnea (adult) (pediatric): Secondary | ICD-10-CM | POA: Diagnosis not present

## 2020-05-23 DIAGNOSIS — E291 Testicular hypofunction: Secondary | ICD-10-CM | POA: Diagnosis not present

## 2020-05-23 DIAGNOSIS — E559 Vitamin D deficiency, unspecified: Secondary | ICD-10-CM | POA: Diagnosis not present

## 2020-05-23 DIAGNOSIS — E782 Mixed hyperlipidemia: Secondary | ICD-10-CM | POA: Diagnosis not present

## 2020-05-23 DIAGNOSIS — Z79899 Other long term (current) drug therapy: Secondary | ICD-10-CM | POA: Diagnosis not present

## 2020-08-06 DIAGNOSIS — Z01818 Encounter for other preprocedural examination: Secondary | ICD-10-CM | POA: Diagnosis not present

## 2021-03-11 DIAGNOSIS — M542 Cervicalgia: Secondary | ICD-10-CM | POA: Diagnosis not present

## 2021-03-27 DIAGNOSIS — M542 Cervicalgia: Secondary | ICD-10-CM | POA: Diagnosis not present

## 2021-07-21 DIAGNOSIS — M542 Cervicalgia: Secondary | ICD-10-CM | POA: Diagnosis not present

## 2021-07-29 DIAGNOSIS — S161XXD Strain of muscle, fascia and tendon at neck level, subsequent encounter: Secondary | ICD-10-CM | POA: Diagnosis not present

## 2021-12-14 DIAGNOSIS — E782 Mixed hyperlipidemia: Secondary | ICD-10-CM | POA: Diagnosis not present

## 2021-12-14 DIAGNOSIS — K429 Umbilical hernia without obstruction or gangrene: Secondary | ICD-10-CM | POA: Diagnosis not present

## 2021-12-14 DIAGNOSIS — E291 Testicular hypofunction: Secondary | ICD-10-CM | POA: Diagnosis not present

## 2021-12-14 DIAGNOSIS — E559 Vitamin D deficiency, unspecified: Secondary | ICD-10-CM | POA: Diagnosis not present

## 2021-12-14 DIAGNOSIS — Z125 Encounter for screening for malignant neoplasm of prostate: Secondary | ICD-10-CM | POA: Diagnosis not present

## 2021-12-14 DIAGNOSIS — G4733 Obstructive sleep apnea (adult) (pediatric): Secondary | ICD-10-CM | POA: Diagnosis not present

## 2021-12-14 DIAGNOSIS — Z79899 Other long term (current) drug therapy: Secondary | ICD-10-CM | POA: Diagnosis not present

## 2021-12-14 DIAGNOSIS — Z0000 Encounter for general adult medical examination without abnormal findings: Secondary | ICD-10-CM | POA: Diagnosis not present

## 2022-03-05 DIAGNOSIS — M25562 Pain in left knee: Secondary | ICD-10-CM | POA: Diagnosis not present

## 2022-03-24 DIAGNOSIS — Z01812 Encounter for preprocedural laboratory examination: Secondary | ICD-10-CM | POA: Diagnosis not present

## 2022-03-24 DIAGNOSIS — M25562 Pain in left knee: Secondary | ICD-10-CM | POA: Diagnosis not present

## 2022-04-08 DIAGNOSIS — M1712 Unilateral primary osteoarthritis, left knee: Secondary | ICD-10-CM | POA: Diagnosis not present

## 2022-04-15 DIAGNOSIS — M1712 Unilateral primary osteoarthritis, left knee: Secondary | ICD-10-CM | POA: Diagnosis not present

## 2022-04-15 DIAGNOSIS — G8918 Other acute postprocedural pain: Secondary | ICD-10-CM | POA: Diagnosis not present

## 2022-08-11 DIAGNOSIS — M1711 Unilateral primary osteoarthritis, right knee: Secondary | ICD-10-CM | POA: Diagnosis not present

## 2022-10-08 DIAGNOSIS — K648 Other hemorrhoids: Secondary | ICD-10-CM | POA: Diagnosis not present

## 2022-10-08 DIAGNOSIS — D122 Benign neoplasm of ascending colon: Secondary | ICD-10-CM | POA: Diagnosis not present

## 2022-10-08 DIAGNOSIS — D123 Benign neoplasm of transverse colon: Secondary | ICD-10-CM | POA: Diagnosis not present

## 2022-10-08 DIAGNOSIS — Z1211 Encounter for screening for malignant neoplasm of colon: Secondary | ICD-10-CM | POA: Diagnosis not present

## 2022-10-08 DIAGNOSIS — D125 Benign neoplasm of sigmoid colon: Secondary | ICD-10-CM | POA: Diagnosis not present

## 2023-03-03 ENCOUNTER — Other Ambulatory Visit (HOSPITAL_BASED_OUTPATIENT_CLINIC_OR_DEPARTMENT_OTHER): Payer: Self-pay | Admitting: Internal Medicine

## 2023-03-03 DIAGNOSIS — Z0000 Encounter for general adult medical examination without abnormal findings: Secondary | ICD-10-CM | POA: Diagnosis not present

## 2023-03-03 DIAGNOSIS — E782 Mixed hyperlipidemia: Secondary | ICD-10-CM | POA: Diagnosis not present

## 2023-03-03 DIAGNOSIS — Z125 Encounter for screening for malignant neoplasm of prostate: Secondary | ICD-10-CM | POA: Diagnosis not present

## 2023-03-03 DIAGNOSIS — E291 Testicular hypofunction: Secondary | ICD-10-CM | POA: Diagnosis not present

## 2023-03-03 DIAGNOSIS — Z79899 Other long term (current) drug therapy: Secondary | ICD-10-CM | POA: Diagnosis not present

## 2023-03-03 DIAGNOSIS — E559 Vitamin D deficiency, unspecified: Secondary | ICD-10-CM | POA: Diagnosis not present

## 2023-03-25 ENCOUNTER — Ambulatory Visit (HOSPITAL_COMMUNITY)
Admission: RE | Admit: 2023-03-25 | Discharge: 2023-03-25 | Disposition: A | Discharge status: Home / Self Care | Payer: BLUE CROSS/BLUE SHIELD | Source: Ambulatory Visit | Attending: Internal Medicine | Admitting: Internal Medicine

## 2023-03-25 DIAGNOSIS — E782 Mixed hyperlipidemia: Secondary | ICD-10-CM | POA: Insufficient documentation

## 2023-05-02 DIAGNOSIS — G4733 Obstructive sleep apnea (adult) (pediatric): Secondary | ICD-10-CM | POA: Diagnosis not present

## 2023-06-17 DIAGNOSIS — G4733 Obstructive sleep apnea (adult) (pediatric): Secondary | ICD-10-CM | POA: Diagnosis not present

## 2023-07-18 DIAGNOSIS — G4733 Obstructive sleep apnea (adult) (pediatric): Secondary | ICD-10-CM | POA: Diagnosis not present

## 2023-08-17 DIAGNOSIS — G4733 Obstructive sleep apnea (adult) (pediatric): Secondary | ICD-10-CM | POA: Diagnosis not present

## 2023-09-17 DIAGNOSIS — G4733 Obstructive sleep apnea (adult) (pediatric): Secondary | ICD-10-CM | POA: Diagnosis not present

## 2023-09-19 DIAGNOSIS — G4733 Obstructive sleep apnea (adult) (pediatric): Secondary | ICD-10-CM | POA: Diagnosis not present

## 2023-10-17 DIAGNOSIS — G4733 Obstructive sleep apnea (adult) (pediatric): Secondary | ICD-10-CM | POA: Diagnosis not present

## 2023-10-19 DIAGNOSIS — G4733 Obstructive sleep apnea (adult) (pediatric): Secondary | ICD-10-CM | POA: Diagnosis not present

## 2023-11-17 DIAGNOSIS — G4733 Obstructive sleep apnea (adult) (pediatric): Secondary | ICD-10-CM | POA: Diagnosis not present

## 2023-11-19 DIAGNOSIS — G4733 Obstructive sleep apnea (adult) (pediatric): Secondary | ICD-10-CM | POA: Diagnosis not present

## 2023-12-18 DIAGNOSIS — G4733 Obstructive sleep apnea (adult) (pediatric): Secondary | ICD-10-CM | POA: Diagnosis not present

## 2024-01-15 DIAGNOSIS — G4733 Obstructive sleep apnea (adult) (pediatric): Secondary | ICD-10-CM | POA: Diagnosis not present

## 2024-01-19 DIAGNOSIS — J329 Chronic sinusitis, unspecified: Secondary | ICD-10-CM | POA: Diagnosis not present

## 2024-03-09 DIAGNOSIS — M1711 Unilateral primary osteoarthritis, right knee: Secondary | ICD-10-CM | POA: Diagnosis not present

## 2024-03-16 DIAGNOSIS — G4733 Obstructive sleep apnea (adult) (pediatric): Secondary | ICD-10-CM | POA: Diagnosis not present

## 2024-03-19 ENCOUNTER — Other Ambulatory Visit: Payer: Self-pay | Admitting: Internal Medicine

## 2024-03-19 DIAGNOSIS — Z125 Encounter for screening for malignant neoplasm of prostate: Secondary | ICD-10-CM | POA: Diagnosis not present

## 2024-03-19 DIAGNOSIS — E291 Testicular hypofunction: Secondary | ICD-10-CM | POA: Diagnosis not present

## 2024-03-19 DIAGNOSIS — Z79899 Other long term (current) drug therapy: Secondary | ICD-10-CM | POA: Diagnosis not present

## 2024-03-19 DIAGNOSIS — G4733 Obstructive sleep apnea (adult) (pediatric): Secondary | ICD-10-CM | POA: Diagnosis not present

## 2024-03-19 DIAGNOSIS — R0609 Other forms of dyspnea: Secondary | ICD-10-CM | POA: Diagnosis not present

## 2024-03-19 DIAGNOSIS — E559 Vitamin D deficiency, unspecified: Secondary | ICD-10-CM | POA: Diagnosis not present

## 2024-03-19 DIAGNOSIS — Z0000 Encounter for general adult medical examination without abnormal findings: Secondary | ICD-10-CM | POA: Diagnosis not present

## 2024-03-19 DIAGNOSIS — I77810 Thoracic aortic ectasia: Secondary | ICD-10-CM | POA: Diagnosis not present

## 2024-03-19 DIAGNOSIS — E782 Mixed hyperlipidemia: Secondary | ICD-10-CM | POA: Diagnosis not present

## 2024-03-30 ENCOUNTER — Ambulatory Visit
Admission: RE | Admit: 2024-03-30 | Discharge: 2024-03-30 | Discharge status: Home / Self Care | Source: Ambulatory Visit | Attending: Internal Medicine | Admitting: Internal Medicine

## 2024-03-30 DIAGNOSIS — I77810 Thoracic aortic ectasia: Secondary | ICD-10-CM

## 2024-03-30 DIAGNOSIS — I2510 Atherosclerotic heart disease of native coronary artery without angina pectoris: Secondary | ICD-10-CM | POA: Diagnosis not present

## 2024-03-30 DIAGNOSIS — E041 Nontoxic single thyroid nodule: Secondary | ICD-10-CM | POA: Diagnosis not present

## 2024-03-30 MED ORDER — IOPAMIDOL (ISOVUE-370) INJECTION 76%
75.0000 mL | Freq: Once | INTRAVENOUS | Status: AC | PRN
  Administered 2024-03-30: 75 mL via INTRAVENOUS

## 2024-04-02 DIAGNOSIS — M1711 Unilateral primary osteoarthritis, right knee: Secondary | ICD-10-CM | POA: Diagnosis not present

## 2024-04-16 DIAGNOSIS — G4733 Obstructive sleep apnea (adult) (pediatric): Secondary | ICD-10-CM | POA: Diagnosis not present

## 2024-04-19 DIAGNOSIS — M25761 Osteophyte, right knee: Secondary | ICD-10-CM | POA: Diagnosis not present

## 2024-04-19 DIAGNOSIS — M1711 Unilateral primary osteoarthritis, right knee: Secondary | ICD-10-CM | POA: Diagnosis not present

## 2024-05-10 DIAGNOSIS — G4733 Obstructive sleep apnea (adult) (pediatric): Secondary | ICD-10-CM | POA: Diagnosis not present

## 2024-05-16 DIAGNOSIS — E041 Nontoxic single thyroid nodule: Secondary | ICD-10-CM | POA: Diagnosis not present

## 2024-05-16 DIAGNOSIS — G4733 Obstructive sleep apnea (adult) (pediatric): Secondary | ICD-10-CM | POA: Diagnosis not present

## 2024-05-22 ENCOUNTER — Other Ambulatory Visit: Payer: Self-pay | Admitting: Internal Medicine

## 2024-05-22 DIAGNOSIS — E041 Nontoxic single thyroid nodule: Secondary | ICD-10-CM

## 2024-06-04 ENCOUNTER — Other Ambulatory Visit (HOSPITAL_COMMUNITY)
Admission: RE | Admit: 2024-06-04 | Discharge: 2024-06-04 | Disposition: A | Discharge status: Home / Self Care | Source: Ambulatory Visit | Attending: Radiology | Admitting: Radiology

## 2024-06-04 ENCOUNTER — Ambulatory Visit
Admission: RE | Admit: 2024-06-04 | Discharge: 2024-06-04 | Disposition: A | Discharge status: Home / Self Care | Source: Ambulatory Visit | Attending: Internal Medicine | Admitting: Internal Medicine

## 2024-06-04 DIAGNOSIS — E041 Nontoxic single thyroid nodule: Secondary | ICD-10-CM

## 2024-06-09 LAB — CYTOLOGY - NON PAP

## 2024-06-10 DIAGNOSIS — G4733 Obstructive sleep apnea (adult) (pediatric): Secondary | ICD-10-CM | POA: Diagnosis not present

## 2024-06-16 DIAGNOSIS — G4733 Obstructive sleep apnea (adult) (pediatric): Secondary | ICD-10-CM | POA: Diagnosis not present

## 2024-07-04 DIAGNOSIS — E041 Nontoxic single thyroid nodule: Secondary | ICD-10-CM | POA: Diagnosis not present

## 2024-07-05 ENCOUNTER — Other Ambulatory Visit: Payer: Self-pay | Admitting: Surgery

## 2024-07-05 DIAGNOSIS — E041 Nontoxic single thyroid nodule: Secondary | ICD-10-CM

## 2024-07-11 DIAGNOSIS — G4733 Obstructive sleep apnea (adult) (pediatric): Secondary | ICD-10-CM | POA: Diagnosis not present

## 2024-07-25 ENCOUNTER — Other Ambulatory Visit (HOSPITAL_COMMUNITY)
Admission: RE | Admit: 2024-07-25 | Discharge: 2024-07-25 | Disposition: A | Discharge status: Home / Self Care | Source: Ambulatory Visit | Attending: Surgery | Admitting: Surgery

## 2024-07-25 ENCOUNTER — Ambulatory Visit
Admission: RE | Admit: 2024-07-25 | Discharge: 2024-07-25 | Disposition: A | Discharge status: Home / Self Care | Source: Ambulatory Visit | Attending: Surgery | Admitting: Surgery

## 2024-07-25 DIAGNOSIS — E079 Disorder of thyroid, unspecified: Secondary | ICD-10-CM | POA: Diagnosis not present

## 2024-07-25 DIAGNOSIS — E041 Nontoxic single thyroid nodule: Secondary | ICD-10-CM | POA: Diagnosis not present

## 2024-07-27 LAB — CYTOLOGY - NON PAP

## 2024-07-30 ENCOUNTER — Ambulatory Visit: Payer: Self-pay | Admitting: Surgery

## 2024-07-30 NOTE — Progress Notes (Signed)
 Thyroid  biopsy shows atypia - will be sent for [facility] testing.  Results will take about 2-3 weeks to come back.  Will notify patient when available.  Krystal Spinner, MD [facility] practice Office: [phone]

## 2024-08-07 DIAGNOSIS — E041 Nontoxic single thyroid nodule: Secondary | ICD-10-CM | POA: Diagnosis not present

## 2024-08-08 ENCOUNTER — Encounter (HOSPITAL_COMMUNITY): Payer: Self-pay

## 2024-08-14 ENCOUNTER — Ambulatory Visit: Payer: Self-pay | Admitting: Surgery

## 2024-08-14 NOTE — Progress Notes (Signed)
 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  Will have patient return to office to discuss results and recommendation for surgery.  Burnard - please arrange a follow up office visit.  Krystal Spinner, MD [facility] practice Office: [phone]

## 2024-09-13 ENCOUNTER — Ambulatory Visit: Payer: Self-pay | Admitting: Surgery

## 2024-09-13 DIAGNOSIS — D440 Neoplasm of uncertain behavior of thyroid gland: Secondary | ICD-10-CM | POA: Diagnosis not present

## 2024-09-13 DIAGNOSIS — E041 Nontoxic single thyroid nodule: Secondary | ICD-10-CM | POA: Diagnosis not present

## 2024-09-25 ENCOUNTER — Encounter (HOSPITAL_COMMUNITY): Payer: Self-pay | Admitting: Surgery

## 2024-09-25 DIAGNOSIS — E041 Nontoxic single thyroid nodule: Secondary | ICD-10-CM | POA: Diagnosis present

## 2024-09-25 DIAGNOSIS — D440 Neoplasm of uncertain behavior of thyroid gland: Secondary | ICD-10-CM | POA: Diagnosis present

## 2024-09-25 NOTE — H&P (Signed)
 PROVIDER: Lind Ausley OZELL SPINNER, MD  Chief Complaint: RE-CHECK ( Discuss AFIRMA and thyroid  surgery/)  History of Present Illness:  Patient returns for follow-up and to discuss results of his fine-needle aspiration cytopathology as well as molecular genetic testing for a suspicious left thyroid  nodule. Patient had been noted on ultrasound in July 2025 to have a 2.9 cm left thyroid  nodule which was moderately suspicious by ultrasound criteria. Patient underwent fine-needle aspiration biopsy. Initial biopsy returned insufficient material for diagnosis. Initial biopsy returned insufficient material for diagnosis. Patient underwent a repeat biopsy which showed cytologic atypia, Bethesda category III. Specimen was submitted for molecular genetic testing with AFIRMA. It returned with a result of suspicious, rendering a risk of malignancy of 50%. Patient returns today to discuss these results and to make plans for management.   Review of Systems: A complete review of systems was obtained from the patient. I have reviewed this information and discussed as appropriate with the patient. See HPI as well for other ROS.  Review of Systems  Constitutional: Negative.  HENT: Negative.  Eyes: Negative.  Respiratory: Negative.  Cardiovascular: Negative.  Gastrointestinal: Negative.  Genitourinary: Negative.  Musculoskeletal: Negative.  Skin: Negative.  Neurological: Negative.  Endo/Heme/Allergies: Negative.  Psychiatric/Behavioral: Negative.    Medical History: Past Medical History:  Diagnosis Date  Sleep apnea   Patient Active Problem List  Diagnosis  Left thyroid  nodule  Neoplasm of uncertain behavior of thyroid  gland   Past Surgical History:  Procedure Laterality Date  HERNIA REPAIR  Neck Surgery  REPLACEMENT TOTAL KNEE  Left 2023, Right 2025    Allergies  Allergen Reactions  Penicillins Nausea And Vomiting and Other (See Comments)  Had as an adult ok   Current Outpatient  Medications on File Prior to Visit  Medication Sig Dispense Refill  aspirin 81 MG EC tablet Take 81 mg by mouth once daily  cholecalciferol (VITAMIN D3) 1000 unit capsule Take 2,000 Units by mouth once daily  multivitamin tablet Take 1 tablet by mouth once daily  naproxen sodium (ALEVE) 220 MG tablet Take 220 mg by mouth as needed  simvastatin (ZOCOR) 20 MG tablet Take 20 mg by mouth once daily  valACYclovir (VALTREX) 500 MG tablet Take 500 mg by mouth once daily   No current facility-administered medications on file prior to visit.   Family History  Problem Relation Age of Onset  Deep vein thrombosis (DVT or abnormal blood clot formation) Mother  Hyperlipidemia (Elevated cholesterol) Mother  High blood pressure (Hypertension) Mother  Skin cancer Mother  Stroke Mother  Diabetes Father  Coronary Artery Disease (Blocked arteries around heart) Father  Hyperlipidemia (Elevated cholesterol) Father  High blood pressure (Hypertension) Father  Skin cancer Father  Diabetes Brother  Hyperlipidemia (Elevated cholesterol) Brother  Skin cancer Brother    Social History   Tobacco Use  Smoking Status Never  Smokeless Tobacco Never    Social History   Socioeconomic History  Marital status: Single  Tobacco Use  Smoking status: Never  Smokeless tobacco: Never  Vaping Use  Vaping status: Unknown  Substance and Sexual Activity  Alcohol use: Yes  Alcohol/week: 0.0 - 1.0 standard drinks of alcohol  Drug use: Never  Sexual activity: Defer   Social Drivers of Health   Housing Stability: Unknown (07/04/2024)  Housing Stability Vital Sign  Homeless in the Last Year: No   Objective:   Vitals:  Pulse: 77  Resp: 16  Temp: 36.7 C (98 F)  SpO2: 99%  Weight: (!) 111.4 kg (  245 lb 9.6 oz)  Height: 172.7 cm (5' 8)  PainSc: 0-No pain   Body mass index is 37.34 kg/m.  Physical Exam   GENERAL APPEARANCE Comfortable, no acute issues Development: normal Gross deformities:  none  SKIN Rash, lesions, ulcers: none Induration, erythema: none Nodules: none palpable  EYES Conjunctiva and lids: normal Pupils: equal  EARS, NOSE, MOUTH, THROAT External ears: no lesion or deformity External nose: no lesion or deformity Hearing: grossly normal  NECK Symmetric: yes Trachea: midline Well-healed anterior cervical incision  CHEST/CV Not assessed  ABDOMEN Not assessed  GENITOURINARY/RECTAL Not assessed  MUSCULOSKELETAL Station and gait: normal Digits and nails: no clubbing or cyanosis Muscle strength: grossly normal all extremities Deformity: none  LYMPHATIC Cervical: none palpable Supraclavicular: none palpable  PSYCHIATRIC Oriented to person, place, and time: yes Mood and affect: normal for situation Judgment and insight: appropriate for situation   Assessment and Plan:   Neoplasm of uncertain behavior of thyroid  gland Left thyroid  nodule  Patient returns to discuss the results of his fine-needle aspiration biopsy and molecular genetic testing. There is significant cytologic atypia. Molecular genetic testing with AFIRMA renders a 50% risk of malignancy.  I have recommended proceeding with left thyroid  lobectomy. We discussed the risks and benefits of surgery. We discussed the pros and cons of lobectomy versus total thyroidectomy. We discussed the potential need for lifelong thyroid  hormone replacement. We discussed the potential need for radioactive iodine  treatment. We discussed the potential need for additional surgery. We discussed the risk of surgery with the risk of recurrent laryngeal nerve injury and injury to parathyroid glands. We discussed his postoperative recovery and return to work and activities. The patient understands and wishes to proceed with surgery in the near future.  Patient provided with a copy of The Thyroid  Book: Medical and Surgical Treatment of Thyroid  Problems, published by [employer], 16 pages. Book reviewed and  explained to patient during visit today.  Krystal Spinner, MD [facility] practice Office: [phone]

## 2024-09-26 NOTE — Patient Instructions (Signed)
 SURGICAL WAITING ROOM VISITATION Patients having surgery or a procedure may have no more than 2 support people in the waiting area - these visitors may rotate in the visitor waiting room.   Due to an increase in RSV and influenza rates and associated hospitalizations, children ages 88 and under may not visit patients in [facility] hospitals. If the patient needs to stay at the hospital during part of their recovery, the visitor guidelines for inpatient rooms apply.  PRE-OP VISITATION  Pre-op nurse will coordinate an appropriate time for 1 support person to accompany the patient in pre-op.  This support person may not rotate.  This visitor will be contacted when the time is appropriate for the visitor to come back in the pre-op area.  Please refer to the [facility] website for the visitor guidelines for Inpatients (after your surgery is over and you are in a regular room).  You are not required to quarantine at this time prior to your surgery. However, you must do this: Hand Hygiene often Do NOT share personal items Notify your provider if you are in close contact with someone who has COVID or you develop fever 100.4 or greater, new onset of sneezing, cough, sore throat, shortness of breath or body aches.  If you test positive for Covid or have been in contact with anyone that has tested positive in the last 10 days please notify you surgeon.    Your procedure is scheduled on: 10/04/24   Report to [facility] Main Entrance: [place] entrance where the [employer] is available.   Report to admitting at: 5:15 AM  Call this number if you have any questions or problems the morning of surgery [phone]  FOLLOW ANY ADDITIONAL PRE OP INSTRUCTIONS YOU RECEIVED FROM YOUR SURGEON'S OFFICE!!!  Do not eat food after Midnight the night prior to your surgery/procedure.  After Midnight you may have the following liquids until: 4:30 AM DAY OF SURGERY  Clear Liquid Diet Water Black  Coffee (sugar ok, NO MILK/CREAM OR CREAMERS)  Tea (sugar ok, NO MILK/CREAM OR CREAMERS) regular and decaf                             Plain Jell-O  with no fruit (NO RED)                                           Fruit ices (not with fruit pulp, NO RED)                                     Popsicles (NO RED)                                                                  Juice: NO CITRUS JUICES: only apple, WHITE grape, WHITE cranberry Sports drinks like Gatorade or Powerade (NO RED)   Oral Hygiene is also important to reduce your risk of infection.        Remember - BRUSH YOUR TEETH THE MORNING OF SURGERY WITH YOUR REGULAR TOOTHPASTE  Do NOT smoke after Midnight the night before surgery.  STOP TAKING all Vitamins, Herbs and supplements 1 week before your surgery.   Take ONLY these medicines the morning of surgery with A SIP OF WATER: NONE                   You may not have any metal on your body including hair pins, jewelry, and body piercing  Do not wear lotions, powders, perfumes / cologne, or deodorant  Men may shave face and neck.  Contacts, Hearing Aids, dentures or bridgework may not be worn into surgery. DENTURES WILL BE REMOVED PRIOR TO SURGERY PLEASE DO NOT APPLY Poly grip OR ADHESIVES!!!  You may bring a small overnight bag with you on the day of surgery, only pack items that are not valuable. [facility] IS NOT RESPONSIBLE   FOR VALUABLES THAT ARE LOST OR STOLEN.   Patients discharged on the day of surgery will not be allowed to drive home.  Someone NEEDS to stay with you for the first 24 hours after anesthesia.  Do not bring your home medications to the hospital. The Pharmacy will dispense medications listed on your medication list to you during your admission in the Hospital.  Special Instructions: Bring a copy of your healthcare power of attorney and living will documents the day of surgery, if you wish to have them scanned into your [facility] Records-  EPIC  Please read over the following fact sheets you were given: IF YOU HAVE QUESTIONS ABOUT YOUR PRE-OP INSTRUCTIONS, PLEASE CALL [phone]   [facility] : [facility] - Preparing for Surgery      Before surgery, you can play an important role.  Because skin is not sterile, your skin needs to be as free of germs as possible.  You can reduce the number of germs on your skin by washing with CHG (chlorahexidine gluconate) soap before surgery.  CHG is an antiseptic cleaner which kills germs and bonds with the skin to continue killing germs even after washing. Please DO NOT use if you have an allergy to CHG or antibacterial soaps.  If your skin becomes reddened/irritated stop using the CHG and inform your nurse when you arrive at Short Stay. Do not shave (including legs and underarms) for at least 48 hours prior to the first CHG shower.  You may shave your face/neck.  Please follow these instructions carefully:  1.  Shower with CHG Soap the night before surgery ONLY (DO NOT USE THE SOAP THE MORNING OF SURGERY).  2.  If you choose to wash your hair, wash your hair first as usual with your normal  shampoo.  3.  After you shampoo, rinse your hair and body thoroughly to remove the shampoo.                             4.  Use CHG as you would any other liquid soap.  You can apply chg directly to the skin and wash.  Gently with a scrungie or clean washcloth.  5.  Apply the CHG Soap to your body ONLY FROM THE NECK DOWN.   Do not use on face/ open                           Wound or open sores. Avoid contact with eyes, ears mouth and genitals (private parts).  Wash face,  Genitals (private parts) with your normal soap.             6.  Wash thoroughly, paying special attention to the area where your  surgery  will be performed.  7.  Thoroughly rinse your body with warm water from the neck down.  8.  DO NOT shower/wash with your normal soap after using and rinsing off the CHG Soap.                9.   Pat yourself dry with a clean towel.            10.  Wear clean pajamas.            11.  Place clean sheets on your bed the night of your first shower and do not  sleep with pets.  Day of Surgery : Do not apply any CHG, lotions/deodorants the morning of surgery.  Please wear clean clothes to the hospital/surgery center.   FAILURE TO FOLLOW THESE INSTRUCTIONS MAY RESULT IN THE CANCELLATION OF YOUR SURGERY  PATIENT SIGNATURE_________________________________  NURSE SIGNATURE__________________________________  ________________________________________________________________________

## 2024-09-28 ENCOUNTER — Other Ambulatory Visit: Payer: Self-pay

## 2024-09-28 ENCOUNTER — Encounter (HOSPITAL_COMMUNITY): Payer: Self-pay

## 2024-09-28 ENCOUNTER — Encounter (HOSPITAL_COMMUNITY)
Admission: RE | Admit: 2024-09-28 | Discharge: 2024-09-28 | Disposition: A | Discharge status: Home / Self Care | Source: Ambulatory Visit | Attending: Surgery | Admitting: Surgery

## 2024-09-28 VITALS — BP 140/110 | HR 73 | Temp 97.9°F | Ht 68.0 in | Wt 240.0 lb

## 2024-09-28 DIAGNOSIS — Z01818 Encounter for other preprocedural examination: Secondary | ICD-10-CM | POA: Diagnosis not present

## 2024-09-28 DIAGNOSIS — Z01812 Encounter for preprocedural laboratory examination: Secondary | ICD-10-CM | POA: Diagnosis present

## 2024-09-28 DIAGNOSIS — Z01810 Encounter for preprocedural cardiovascular examination: Secondary | ICD-10-CM | POA: Diagnosis not present

## 2024-09-28 HISTORY — DX: Sleep apnea, unspecified: G47.30

## 2024-09-28 LAB — CBC
HCT: 44.2 % (ref 39.0–52.0)
Hemoglobin: 15 g/dL (ref 13.0–17.0)
MCH: 32.7 pg (ref 26.0–34.0)
MCHC: 33.9 g/dL (ref 30.0–36.0)
MCV: 96.3 fL (ref 80.0–100.0)
Platelets: 206 K/uL (ref 150–400)
RBC: 4.59 MIL/uL (ref 4.22–5.81)
RDW: 12.7 % (ref 11.5–15.5)
WBC: 7.1 K/uL (ref 4.0–10.5)
nRBC: 0 % (ref 0.0–0.2)

## 2024-09-28 NOTE — Progress Notes (Addendum)
 For Anesthesia: PCP - Charlott Dorn LABOR, MD  Cardiologist - N/A  Bowel Prep reminder:  Chest x-ray - CT cardiac: 03/25/23. CT angio chest: 04/01/24 EKG - 09/28/24 Stress Test - 12/30/16 ECHO -  Cardiac Cath -  Pacemaker/ICD device last checked: Pacemaker orders received: Device Rep notified:  Spinal Cord Stimulator:N/A  Sleep Study - Yes CPAP - Yes  Fasting Blood Sugar - N/A Checks Blood Sugar _____ times a day Date and result of last Hgb A1c-  Last dose of GLP1 agonist- N/A GLP1 instructions: Hold 7 days prior to schedule (Hold 24 hours-daily)   Last dose of SGLT-2 inhibitors- N/A SGLT-2 instructions: Hold 72 hours prior to surgery  Blood Thinner Instructions:N/A Last Dose: Time last taken:  Aspirin Instructions:On hold Last Dose: Time last taken:  Activity level: Can go up a flight of stairs and activities of daily living without stopping and without chest pain and/or shortness of breath   Able to exercise without chest pain and/or shortness of breath  Anesthesia review: OSA(CPAP).BP elevated (140/100) during PST,EKG was done  Patient denies shortness of breath, fever, cough and chest pain at PAT appointment   Patient verbalized understanding of instructions that were reviewed over the telephone.

## 2024-10-03 ENCOUNTER — Encounter (HOSPITAL_COMMUNITY): Payer: Self-pay | Admitting: Surgery

## 2024-10-03 NOTE — Anesthesia Preprocedure Evaluation (Signed)
 Anesthesia Evaluation  Patient identified by MRN, date of birth, ID band Patient awake    Reviewed: Allergy & Precautions, NPO status , Patient's Chart, lab work & pertinent test results  History of Anesthesia Complications Negative for: history of anesthetic complications  Airway Mallampati: II  TM Distance: >3 FB Neck ROM: Full   Comment: Small mouth Dental  (+) Dental Advisory Given   Pulmonary sleep apnea and Continuous Positive Airway Pressure Ventilation    breath sounds clear to auscultation       Cardiovascular (-) angina (-) CAD  Rhythm:Regular Rate:Normal     Neuro/Psych negative neurological ROS     GI/Hepatic Neg liver ROS,GERD  Medicated and Controlled,,  Endo/Other  BMI 36.5  Renal/GU negative Renal ROS     Musculoskeletal   Abdominal   Peds  Hematology 15.0, plt 206k   Anesthesia Other Findings   Reproductive/Obstetrics                              Anesthesia Physical Anesthesia Plan  ASA: 2  Anesthesia Plan: General   Post-op Pain Management: Tylenol  PO (pre-op)*   Induction: Intravenous  PONV Risk Score and Plan: 2 and Ondansetron  and Dexamethasone   Airway Management Planned: Oral ETT  Additional Equipment: None  Intra-op Plan:   Post-operative Plan: Extubation in OR  Informed Consent: I have reviewed the patients History and Physical, chart, labs and discussed the procedure including the risks, benefits and alternatives for the proposed anesthesia with the patient or authorized representative who has indicated his/her understanding and acceptance.     Dental advisory given  Plan Discussed with: CRNA and Surgeon  Anesthesia Plan Comments:          Anesthesia Quick Evaluation

## 2024-10-04 ENCOUNTER — Encounter (HOSPITAL_COMMUNITY): Payer: Self-pay | Admitting: Surgery

## 2024-10-04 ENCOUNTER — Encounter (HOSPITAL_COMMUNITY): Payer: Self-pay | Admitting: Anesthesiology

## 2024-10-04 ENCOUNTER — Ambulatory Visit (HOSPITAL_COMMUNITY): Admission: RE | Admit: 2024-10-04 | Discharge: 2024-10-05 | Disposition: A | Attending: Surgery | Admitting: Surgery

## 2024-10-04 ENCOUNTER — Ambulatory Visit (HOSPITAL_COMMUNITY): Payer: Self-pay | Admitting: Anesthesiology

## 2024-10-04 ENCOUNTER — Other Ambulatory Visit: Payer: Self-pay

## 2024-10-04 ENCOUNTER — Encounter (HOSPITAL_COMMUNITY): Admission: RE | Disposition: A | Payer: Self-pay | Source: Home / Self Care | Attending: Surgery

## 2024-10-04 DIAGNOSIS — D440 Neoplasm of uncertain behavior of thyroid gland: Secondary | ICD-10-CM | POA: Diagnosis not present

## 2024-10-04 DIAGNOSIS — Z79624 Long term (current) use of inhibitors of nucleotide synthesis: Secondary | ICD-10-CM | POA: Diagnosis not present

## 2024-10-04 DIAGNOSIS — K219 Gastro-esophageal reflux disease without esophagitis: Secondary | ICD-10-CM | POA: Diagnosis not present

## 2024-10-04 DIAGNOSIS — Z7982 Long term (current) use of aspirin: Secondary | ICD-10-CM | POA: Diagnosis not present

## 2024-10-04 DIAGNOSIS — D34 Benign neoplasm of thyroid gland: Secondary | ICD-10-CM | POA: Diagnosis not present

## 2024-10-04 DIAGNOSIS — Z79899 Other long term (current) drug therapy: Secondary | ICD-10-CM | POA: Diagnosis not present

## 2024-10-04 DIAGNOSIS — E041 Nontoxic single thyroid nodule: Secondary | ICD-10-CM | POA: Diagnosis present

## 2024-10-04 DIAGNOSIS — G4730 Sleep apnea, unspecified: Secondary | ICD-10-CM | POA: Diagnosis not present

## 2024-10-04 HISTORY — PX: THYROID LOBECTOMY: SHX420

## 2024-10-04 SURGERY — LOBECTOMY, THYROID
Anesthesia: General | Laterality: Left

## 2024-10-04 MED ORDER — DEXAMETHASONE SOD PHOSPHATE PF 10 MG/ML IJ SOLN
INTRAMUSCULAR | Status: DC | PRN
  Administered 2024-10-04: 10 mg via INTRAVENOUS

## 2024-10-04 MED ORDER — SUGAMMADEX SODIUM 200 MG/2ML IV SOLN
INTRAVENOUS | Status: AC
  Filled 2024-10-04: qty 2

## 2024-10-04 MED ORDER — PROPOFOL 10 MG/ML IV BOLUS
INTRAVENOUS | Status: AC
  Filled 2024-10-04: qty 20

## 2024-10-04 MED ORDER — TRAMADOL HCL 50 MG PO TABS: 50.0000 mg | ORAL_TABLET | Freq: Four times a day (QID) | ORAL | Status: DC | PRN

## 2024-10-04 MED ORDER — OXYCODONE HCL 5 MG PO TABS
5.0000 mg | ORAL_TABLET | Freq: Once | ORAL | Status: AC | PRN
  Administered 2024-10-04: 5 mg via ORAL

## 2024-10-04 MED ORDER — PROPOFOL 10 MG/ML IV BOLUS
INTRAVENOUS | Status: DC | PRN
  Administered 2024-10-04: 200 mg via INTRAVENOUS

## 2024-10-04 MED ORDER — OXYCODONE HCL 5 MG PO TABS: 5.0000 mg | ORAL_TABLET | ORAL | Status: DC | PRN

## 2024-10-04 MED ORDER — OXYCODONE HCL 5 MG/5ML PO SOLN: 5.0000 mg | Freq: Once | ORAL | Status: AC | PRN

## 2024-10-04 MED ORDER — ACETAMINOPHEN 325 MG PO TABS
650.0000 mg | ORAL_TABLET | Freq: Four times a day (QID) | ORAL | Status: DC | PRN
  Administered 2024-10-04: 650 mg via ORAL
  Filled 2024-10-04: qty 2

## 2024-10-04 MED ORDER — HYDROMORPHONE HCL 1 MG/ML IJ SOLN
0.2500 mg | INTRAMUSCULAR | Status: DC | PRN
  Administered 2024-10-04 (×4): 0.5 mg via INTRAVENOUS

## 2024-10-04 MED ORDER — ONDANSETRON HCL 4 MG/2ML IJ SOLN
INTRAMUSCULAR | Status: AC
  Filled 2024-10-04: qty 2

## 2024-10-04 MED ORDER — MIDAZOLAM HCL 5 MG/5ML IJ SOLN
INTRAMUSCULAR | Status: DC | PRN
  Administered 2024-10-04: 2 mg via INTRAVENOUS

## 2024-10-04 MED ORDER — MIDAZOLAM HCL (PF) 2 MG/2ML IJ SOLN: 0.5000 mg | Freq: Once | INTRAMUSCULAR | Status: DC | PRN

## 2024-10-04 MED ORDER — FENTANYL CITRATE (PF) 100 MCG/2ML IJ SOLN
INTRAMUSCULAR | Status: DC | PRN
  Administered 2024-10-04: 50 ug via INTRAVENOUS
  Administered 2024-10-04: 150 ug via INTRAVENOUS
  Administered 2024-10-04: 50 ug via INTRAVENOUS

## 2024-10-04 MED ORDER — ROCURONIUM BROMIDE 10 MG/ML (PF) SYRINGE
PREFILLED_SYRINGE | INTRAVENOUS | Status: AC
  Filled 2024-10-04: qty 10

## 2024-10-04 MED ORDER — LABETALOL HCL 5 MG/ML IV SOLN
INTRAVENOUS | Status: AC
  Filled 2024-10-04: qty 4

## 2024-10-04 MED ORDER — LIDOCAINE HCL (PF) 2 % IJ SOLN
INTRAMUSCULAR | Status: AC
  Filled 2024-10-04: qty 5

## 2024-10-04 MED ORDER — HEMOSTATIC AGENTS (NO CHARGE) OPTIME
TOPICAL | Status: DC | PRN
  Administered 2024-10-04: 1

## 2024-10-04 MED ORDER — MIDAZOLAM HCL 2 MG/2ML IJ SOLN
INTRAMUSCULAR | Status: AC
  Filled 2024-10-04: qty 2

## 2024-10-04 MED ORDER — CHLORHEXIDINE GLUCONATE CLOTH 2 % EX PADS: 6.0000 | MEDICATED_PAD | Freq: Once | CUTANEOUS | Status: DC

## 2024-10-04 MED ORDER — ROCURONIUM BROMIDE 10 MG/ML (PF) SYRINGE
PREFILLED_SYRINGE | INTRAVENOUS | Status: DC | PRN
  Administered 2024-10-04: 60 mg via INTRAVENOUS
  Administered 2024-10-04 (×2): 10 mg via INTRAVENOUS

## 2024-10-04 MED ORDER — ONDANSETRON HCL 4 MG/2ML IJ SOLN
INTRAMUSCULAR | Status: DC | PRN
  Administered 2024-10-04: 4 mg via INTRAVENOUS

## 2024-10-04 MED ORDER — OXYCODONE HCL 5 MG PO TABS
ORAL_TABLET | ORAL | Status: AC
  Filled 2024-10-04: qty 1

## 2024-10-04 MED ORDER — 0.9 % SODIUM CHLORIDE (POUR BTL) OPTIME
TOPICAL | Status: DC | PRN
  Administered 2024-10-04: 1000 mL

## 2024-10-04 MED ORDER — SODIUM CHLORIDE 0.45 % IV SOLN: INTRAVENOUS | Status: DC

## 2024-10-04 MED ORDER — ACETAMINOPHEN 500 MG PO TABS
1000.0000 mg | ORAL_TABLET | Freq: Once | ORAL | Status: AC
  Administered 2024-10-04: 1000 mg via ORAL
  Filled 2024-10-04: qty 2

## 2024-10-04 MED ORDER — ONDANSETRON 4 MG PO TBDP: 4.0000 mg | ORAL_TABLET | Freq: Four times a day (QID) | ORAL | Status: DC | PRN

## 2024-10-04 MED ORDER — MEPERIDINE HCL 25 MG/ML IJ SOLN: 6.2500 mg | INTRAMUSCULAR | Status: DC | PRN

## 2024-10-04 MED ORDER — HYDROMORPHONE HCL 1 MG/ML IJ SOLN
INTRAMUSCULAR | Status: AC
  Filled 2024-10-04: qty 1

## 2024-10-04 MED ORDER — LACTATED RINGERS IV SOLN: INTRAVENOUS | Status: DC

## 2024-10-04 MED ORDER — LIDOCAINE HCL (PF) 2 % IJ SOLN
INTRAMUSCULAR | Status: DC | PRN
  Administered 2024-10-04: 40 mg via INTRADERMAL

## 2024-10-04 MED ORDER — ORAL CARE MOUTH RINSE: 15.0000 mL | Freq: Once | OROMUCOSAL | Status: AC

## 2024-10-04 MED ORDER — EPHEDRINE 5 MG/ML INJ
INTRAVENOUS | Status: AC
  Filled 2024-10-04: qty 5

## 2024-10-04 MED ORDER — CIPROFLOXACIN IN D5W 400 MG/200ML IV SOLN
400.0000 mg | INTRAVENOUS | Status: AC
  Administered 2024-10-04: 400 mg via INTRAVENOUS
  Filled 2024-10-04: qty 200

## 2024-10-04 MED ORDER — SUGAMMADEX SODIUM 200 MG/2ML IV SOLN
INTRAVENOUS | Status: DC | PRN
  Administered 2024-10-04: 200 mg via INTRAVENOUS

## 2024-10-04 MED ORDER — PROPOFOL 1000 MG/100ML IV EMUL
INTRAVENOUS | Status: AC
  Filled 2024-10-04: qty 100

## 2024-10-04 MED ORDER — LABETALOL HCL 5 MG/ML IV SOLN
5.0000 mg | Freq: Once | INTRAVENOUS | Status: AC
  Administered 2024-10-04: 5 mg via INTRAVENOUS

## 2024-10-04 MED ORDER — ONDANSETRON HCL 4 MG/2ML IJ SOLN
4.0000 mg | Freq: Four times a day (QID) | INTRAMUSCULAR | Status: DC | PRN
  Administered 2024-10-04: 4 mg via INTRAVENOUS
  Filled 2024-10-04: qty 2

## 2024-10-04 MED ORDER — CHLORHEXIDINE GLUCONATE 0.12 % MT SOLN
15.0000 mL | Freq: Once | OROMUCOSAL | Status: AC
  Administered 2024-10-04: 15 mL via OROMUCOSAL

## 2024-10-04 MED ORDER — FENTANYL CITRATE (PF) 250 MCG/5ML IJ SOLN
INTRAMUSCULAR | Status: AC
  Filled 2024-10-04: qty 5

## 2024-10-04 MED ORDER — HYDROMORPHONE HCL 1 MG/ML IJ SOLN: 1.0000 mg | INTRAMUSCULAR | Status: DC | PRN

## 2024-10-04 MED ORDER — EPHEDRINE SULFATE (PRESSORS) 25 MG/5ML IV SOSY
PREFILLED_SYRINGE | INTRAVENOUS | Status: DC | PRN
  Administered 2024-10-04 (×3): 5 mg via INTRAVENOUS

## 2024-10-04 MED ORDER — OXYCODONE HCL 5 MG PO TABS: 5.0000 mg | ORAL_TABLET | Freq: Four times a day (QID) | ORAL | 0 refills | Status: AC | PRN

## 2024-10-04 MED ORDER — ACETAMINOPHEN 650 MG RE SUPP: 650.0000 mg | Freq: Four times a day (QID) | RECTAL | Status: DC | PRN

## 2024-10-04 SURGICAL SUPPLY — 28 items
BAG COUNTER SPONGE SURGICOUNT (BAG) ×2 IMPLANT
BLADE SURG 15 STRL LF DISP TIS (BLADE) ×2 IMPLANT
CHLORAPREP W/TINT 26 (MISCELLANEOUS) ×2 IMPLANT
CLIP TI MEDIUM 6 (CLIP) ×4 IMPLANT
CLIP TI WIDE RED SMALL 6 (CLIP) ×4 IMPLANT
COVER SURGICAL LIGHT HANDLE (MISCELLANEOUS) ×2 IMPLANT
DERMABOND ADVANCED .7 DNX12 (GAUZE/BANDAGES/DRESSINGS) ×2 IMPLANT
DRAPE LAPAROTOMY T 98X78 PEDS (DRAPES) ×2 IMPLANT
DRAPE UTILITY XL STRL (DRAPES) ×2 IMPLANT
ELECT PENCIL ROCKER SW 15FT (MISCELLANEOUS) ×2 IMPLANT
ELECT REM PT RETURN 15FT ADLT (MISCELLANEOUS) ×2 IMPLANT
GAUZE 4X4 16PLY ~~LOC~~+RFID DBL (SPONGE) ×2 IMPLANT
GLOVE SURG ORTHO 8.0 STRL STRW (GLOVE) ×2 IMPLANT
GOWN STRL REUS W/ TWL XL LVL3 (GOWN DISPOSABLE) ×4 IMPLANT
HEMOSTAT SURGICEL 2X4 FIBR (HEMOSTASIS) ×2 IMPLANT
ILLUMINATOR WAVEGUIDE N/F (MISCELLANEOUS) ×2 IMPLANT
KIT BASIN OR (CUSTOM PROCEDURE TRAY) ×2 IMPLANT
KIT TURNOVER KIT A (KITS) ×2 IMPLANT
PACK BASIC VI WITH GOWN DISP (CUSTOM PROCEDURE TRAY) ×2 IMPLANT
PAD MAGNETIC INSTR ST 16X20 (MISCELLANEOUS) ×2 IMPLANT
SHEARS HARMONIC 9CM CVD (BLADE) ×2 IMPLANT
SUT MNCRL AB 4-0 PS2 18 (SUTURE) ×2 IMPLANT
SUT PROLENE 4-0 RB1 .5 CRCL 36 (SUTURE) IMPLANT
SUT SILK 3 0 SH 30 (SUTURE) ×2 IMPLANT
SUT VIC AB 3-0 SH 18 (SUTURE) ×4 IMPLANT
SYR BULB IRRIG 60ML STRL (SYRINGE) ×2 IMPLANT
TOWEL OR DSP ST BLU DLX 10/PK (DISPOSABLE) ×2 IMPLANT
TUBING CONNECTING 10 (TUBING) ×2 IMPLANT

## 2024-10-04 NOTE — Progress Notes (Signed)
   10/04/24 2102  BiPAP/CPAP/SIPAP  $ Non-Invasive Home Ventilator  Initial  BiPAP/CPAP/SIPAP Pt Type Adult (Patient prefers self placement when ready)  BiPAP/CPAP/SIPAP Resmed  Mask Type Nasal mask  Dentures removed? Not applicable  FiO2 (%) 21 %  Patient Home Machine No  Patient Home Mask Yes  Patient Home Tubing Yes  Auto Titrate Yes  Minimum cmH2O 5 cmH2O  Maximum cmH2O 20 cmH2O  Device Plugged into RED Power Outlet Yes

## 2024-10-04 NOTE — Anesthesia Procedure Notes (Signed)
 Procedure Name: Intubation Date/Time: 10/04/2024 7:31 AM  Performed by: Zulema Leita PARAS, CRNAPre-anesthesia Checklist: Patient identified, Emergency Drugs available, Suction available and Patient being monitored Patient Re-evaluated:Patient Re-evaluated prior to induction Oxygen Delivery Method: Circle system utilized Preoxygenation: Pre-oxygenation with 100% oxygen Induction Type: IV induction Ventilation: Mask ventilation without difficulty and Oral airway inserted - appropriate to patient size Laryngoscope Size: Mac and 4 Grade View: Grade I Tube type: Oral Airway Equipment and Method: Stylet and Oral airway Placement Confirmation: ETT inserted through vocal cords under direct vision, positive ETCO2 and breath sounds checked- equal and bilateral Secured at: 21 cm Tube secured with: Tape Dental Injury: Teeth and Oropharynx as per pre-operative assessment

## 2024-10-04 NOTE — Interval H&P Note (Signed)
 History and Physical Interval Note:  10/04/2024 7:02 AM  Glenn Ortiz  has presented today for surgery, with the diagnosis of thyroid  neoplasm of uncertain behavior.  The various methods of treatment have been discussed with the patient and family. After consideration of risks, benefits and other options for treatment, the patient has consented to    Procedure(s): LOBECTOMY, THYROID  (Left) as a surgical intervention.    The patient's history has been reviewed, patient examined, no change in status, stable for surgery.  I have reviewed the patient's chart and labs.  Questions were answered to the patient's satisfaction.    Krystal Spinner, MD [facility] practice Office: [phone]   Krystal Spinner

## 2024-10-04 NOTE — Transfer of Care (Signed)
 Immediate Anesthesia Transfer of Care Note  Patient: WILTON THRALL  Procedure(s) Performed: LOBECTOMY, THYROID  (Left)  Patient Location: PACU  Anesthesia Type:General  Level of Consciousness: awake, alert , and oriented  Airway & Oxygen Therapy: Patient Spontanous Breathing and Patient connected to face mask oxygen  Post-op Assessment: Report given to RN and Post -op Vital signs reviewed and stable  Post vital signs: Reviewed and stable  Last Vitals:  Vitals Value Taken Time  BP 142/87 10/04/24 09:19  Temp    Pulse 74 10/04/24 09:20  Resp 17 10/04/24 09:20  SpO2 99 % 10/04/24 09:20  Vitals shown include unfiled device data.  Last Pain:  Vitals:   10/04/24 0620  TempSrc:   PainSc: 0-No pain         Complications: No notable events documented.

## 2024-10-04 NOTE — Anesthesia Postprocedure Evaluation (Signed)
 Anesthesia Post Note  Patient: Glenn Ortiz  Procedure(s) Performed: LOBECTOMY, THYROID  (Left)     Patient location during evaluation: PACU Anesthesia Type: General Level of consciousness: awake and alert, oriented and patient cooperative Pain management: pain level controlled Vital Signs Assessment: post-procedure vital signs reviewed and stable Respiratory status: spontaneous breathing, nonlabored ventilation and respiratory function stable Cardiovascular status: blood pressure returned to baseline and stable Postop Assessment: no apparent nausea or vomiting Anesthetic complications: no   No notable events documented.  Last Vitals:  Vitals:   10/04/24 1100 10/04/24 1125  BP: (!) 149/93 (!) 144/93  Pulse: 63 65  Resp: 14 16  Temp:  36.6 C  SpO2: 93% 96%    Last Pain:  Vitals:   10/04/24 1131  TempSrc:   PainSc: 4                  Milanna Kozlov,E. Cheris Tweten

## 2024-10-04 NOTE — Op Note (Signed)
 Procedure Note  Pre-operative Diagnosis:  left thyroid  nodule, thyroid  neoplasm of uncertain behavior  Post-operative Diagnosis:  same  Surgeon:  Krystal Spinner, MD  Assistant:  none   Procedure:  Left thyroid  lobectomy and isthmusectomy  Anesthesia:  General  Estimated Blood Loss:  25 cc  Drains: none         Specimen: thyroid  lobe to pathology  Indications:  Patient returns for follow-up and to discuss results of his fine-needle aspiration cytopathology as well as molecular genetic testing for a suspicious left thyroid  nodule. Patient had been noted on ultrasound in July 2025 to have a 2.9 cm left thyroid  nodule which was moderately suspicious by ultrasound criteria. Patient underwent fine-needle aspiration biopsy. Initial biopsy returned insufficient material for diagnosis. Initial biopsy returned insufficient material for diagnosis. Patient underwent a repeat biopsy which showed cytologic atypia, Bethesda category III. Specimen was submitted for molecular genetic testing with AFIRMA. It returned with a result of suspicious, rendering a risk of malignancy of 50%. Patient returns today to discuss these results and to make plans for management.   Procedure Details: Procedure was done in OR #8 at the [facility] The patient was brought to the operating room and placed in a supine position on the operating room table. Following administration of general anesthesia, the patient was positioned and then prepped and draped in the usual aseptic fashion. After ascertaining that an adequate level of anesthesia had been achieved, a small Kocher incision was made with #15 blade. Dissection was carried through subcutaneous tissues and platysma. Hemostasis was achieved with the electrocautery. Skin flaps were elevated cephalad and caudad from the thyroid  notch to the sternal notch. A self-retaining retractor was placed for exposure. Strap muscles were incised in the midline and dissection was begun  on the left side. Strap muscles were reflected laterally. The left thyroid  lobe was mildly enlarged with a dominant nodule located posteriorly. The lobe was gently mobilized with blunt dissection. Superior pole vessels were dissected out and divided individually between small and medium ligaclips with the harmonic scalpel. There was moderate scarring from the patient's previous anterior cervical fusion procedure. The thyroid  lobe was rolled anteriorly. Branches of the inferior thyroid  artery were divided between small ligaclips with the harmonic scalpel. Inferior venous tributaries were divided between ligaclips. The recurrent laryngeal nerve was identified and preserved along its course. The ligament of Court was released with the electrocautery and the gland was mobilized onto the anterior trachea. Isthmus was mobilized across the midline. There was no significant pyramidal lobe present. The thyroid  parenchyma was transected at the junction of the isthmus and contralateral thyroid  lobe with the harmonic scalpel. A suture was used to mark the isthmus margin. The thyroid  lobe and isthmus were submitted to pathology for review.  The entire field was palpated for evidence of lymphadenopathy or extra-thyroidal disease.  No worrisome findings were noted.  No enlarged lymph nodes were identified.  The neck was irrigated with warm saline. Fibrillar was placed throughout the operative field. Strap muscles were approximated in the midline with interrupted 3-0 Vicryl sutures. Platysma was closed with interrupted 3-0 Vicryl sutures. Skin was closed with a running 4-0 Monocryl subcuticular suture.  Wound was washed and dried and Dermabond was applied. The patient was awakened from anesthesia and brought to the recovery room. The patient tolerated the procedure well.   Krystal Spinner, MD [facility] Office: [phone]

## 2024-10-04 NOTE — Discharge Instructions (Signed)

## 2024-10-05 ENCOUNTER — Encounter (HOSPITAL_COMMUNITY): Payer: Self-pay | Admitting: Surgery

## 2024-10-05 DIAGNOSIS — Z79624 Long term (current) use of inhibitors of nucleotide synthesis: Secondary | ICD-10-CM | POA: Diagnosis not present

## 2024-10-05 DIAGNOSIS — Z79899 Other long term (current) drug therapy: Secondary | ICD-10-CM | POA: Diagnosis not present

## 2024-10-05 DIAGNOSIS — D34 Benign neoplasm of thyroid gland: Secondary | ICD-10-CM | POA: Diagnosis not present

## 2024-10-05 DIAGNOSIS — Z7982 Long term (current) use of aspirin: Secondary | ICD-10-CM | POA: Diagnosis not present

## 2024-10-05 DIAGNOSIS — D440 Neoplasm of uncertain behavior of thyroid gland: Secondary | ICD-10-CM | POA: Diagnosis not present

## 2024-10-05 DIAGNOSIS — K219 Gastro-esophageal reflux disease without esophagitis: Secondary | ICD-10-CM | POA: Diagnosis not present

## 2024-10-05 DIAGNOSIS — G4730 Sleep apnea, unspecified: Secondary | ICD-10-CM | POA: Diagnosis not present

## 2024-10-05 MED ORDER — ACETAMINOPHEN 500 MG PO TABS: 500.0000 mg | ORAL_TABLET | Freq: Four times a day (QID) | ORAL | Status: AC | PRN

## 2024-10-05 NOTE — Progress Notes (Signed)
   10/05/24 1124  TOC Brief Assessment  Insurance and Status Reviewed  Patient has primary care physician Yes  Home environment has been reviewed resides in a private residence  Prior level of function: Independent  Prior/Current Home Services No current home services  Social Drivers of Health Review SDOH reviewed no interventions necessary  Readmission risk has been reviewed Yes  Transition of care needs no transition of care needs at this time

## 2024-10-05 NOTE — Plan of Care (Signed)
   Problem: Education: Goal: Knowledge of General Education information will improve Description Including pain rating scale, medication(s)/side effects and non-pharmacologic comfort measures Outcome: Progressing   Problem: Health Behavior/Discharge Planning: Goal: Ability to manage health-related needs will improve Outcome: Progressing

## 2024-10-05 NOTE — Progress Notes (Signed)
 D/C ambulatory w all belongings in stable condition.

## 2024-10-05 NOTE — Progress Notes (Signed)
 Discharge instructions given to patient questions asked and answered.

## 2024-10-05 NOTE — Progress Notes (Signed)
 Progress Note  1 Day Post-Op  Subjective: Patient reports some soreness. Denies significant pain or new concerns. Had minimal nausea yesterday that has resolved. Denies nausea currently. Denies vomiting. Tolerating regular diet. Reports that he is urinating well. Has not had BM since surgery.   ROS  All negative with the exception of above.  Objective: Vital signs in last 24 hours: Temp:  [97.3 F (36.3 C)-98.2 F (36.8 C)] 98.2 F (36.8 C) (12/05 0552) Pulse Rate:  [62-85] 75 (12/05 0552) Resp:  [12-19] 16 (12/05 0552) BP: (118-155)/(75-107) 118/79 (12/05 0552) SpO2:  [91 %-97 %] 97 % (12/05 0552) FiO2 (%):  [21 %] 21 % (12/04 2102) Last BM Date : 10/04/24  Intake/Output from previous day: 12/04 0701 - 12/05 0700 In: 2885.3 [P.O.:660; I.V.:2025.3; IV Piggyback:200] Out: 2615 [Urine:2600; Blood:15] Intake/Output this shift: No intake/output data recorded.  PE: General: Pleasant male who is laying in bed in NAD. HEENT: Head is normocephalic, atraumatic.  Sclera are noninjected. EOMI. Surgical incision of neck present with dermabond. Small amount of ecchymosis along incision. Incision is C/D/I without concern of active bleeding or infection.  Heart: HR normal during encounter.  Lungs: Respiratory effort nonlabored. MS: Able to move all 4 extremities.  Skin: Warm and dry.  Psych: A&Ox3 with an appropriate affect.    Lab Results:  No results for input(s): WBC, HGB, HCT, PLT in the last 72 hours. BMET No results for input(s): NA, K, CL, CO2, GLUCOSE, BUN, CREATININE, CALCIUM  in the last 72 hours. PT/INR No results for input(s): LABPROT, INR in the last 72 hours. CMP     Component Value Date/Time   NA 137 04/03/2013 0854   K 3.9 04/03/2013 0854   CL 103 04/03/2013 0854   CO2 24 04/03/2013 0854   GLUCOSE 96 04/03/2013 0854   BUN 14 04/03/2013 0854   CREATININE 0.79 04/03/2013 0854   CALCIUM  9.4 04/03/2013 0854   PROT 6.9 04/03/2013 0854    ALBUMIN 3.8 04/03/2013 0854   AST 25 04/03/2013 0854   ALT 52 04/03/2013 0854   ALKPHOS 59 04/03/2013 0854   BILITOT 0.6 04/03/2013 0854   GFRNONAA >90 04/03/2013 0854   GFRAA >90 04/03/2013 0854   Lipase  No results found for: LIPASE     Studies/Results: No results found.  Anti-infectives: Anti-infectives (From admission, onward)    Start     Dose/Rate Route Frequency Ordered Stop   10/04/24 0600  ciprofloxacin  (CIPRO ) IVPB 400 mg        400 mg 200 mL/hr over 60 Minutes Intravenous On call to O.R. 10/04/24 0545 10/04/24 0734        Assessment/Plan POD1: S/P Left thyroid  lobectomy and isthmusectomy by Dr. Krystal Spinner on 12/4. -Afebrile. Vitals stable. -Pathology pending.  -Pt having some soreness around area of incision. Doing well overall. Denies significant pain or new concerns. -Exam without concern of infection or active bleeding. -Discussed post-operative expectations and restrictions. Provided precautions. Patient is aware to contact office to confirm his follow up visit with Dr. Spinner. Will prepare for discharge this AM.   FEN: Regualr diet VTE: SCDs ID: None    LOS: 0 days   I reviewed provider notes, specialist notes, nursing notes, last 24 h vitals and pain scores, last 48 h intake and output, last 24 h labs and trends, and last 24 h imaging results.   Marjorie Carlyon Favre, [facility] 10/05/2024, 9:45 AM Please see Amion for pager number during day hours 7:00am-4:30pm

## 2024-10-05 NOTE — Plan of Care (Signed)

## 2024-10-05 NOTE — Discharge Summary (Signed)
    Physician Discharge Summary   Patient ID: Glenn Ortiz MRN: [id number] DOB/AGE: May 24, 1971 53 y.o.  Admit date: 10/04/2024  Discharge date: 10/05/2024  Discharge Diagnoses:  Principal Problem:   Neoplasm of uncertain behavior of thyroid  gland Active Problems:   Left thyroid  nodule   Discharged Condition: good  Hospital Course: Patient was admitted for observation following thyroid  surgery.  Post op course was uncomplicated.  Pain was well controlled.  Tolerated diet.  Patient was prepared for discharge home on POD#1.  Consults: None  Treatments: surgery: thyroid  lobectomy  Discharge Exam: Blood pressure 118/79, pulse 75, temperature 98.2 F (36.8 C), temperature source Oral, resp. rate 16, height 5' 8 (1.727 m), weight 108.9 kg, SpO2 97%. See progress notes.  Disposition: Home   Allergies as of 10/05/2024       Reactions   Penicillins Nausea And Vomiting   Had as an adult ok        Medication List     TAKE these medications    acetaminophen  500 MG tablet Commonly known as: TYLENOL  Take 1 tablet (500 mg total) by mouth every 6 (six) hours as needed for mild pain (pain score 1-3) (or Fever >/= 101).   aspirin EC 81 MG tablet Take 81 mg by mouth daily. Swallow whole.   famotidine  20 MG tablet Commonly known as: Pepcid  One at bedtime   Fish Oil 1200 MG Caps Take 1,200 mg by mouth daily.   multivitamin tablet Take 1 tablet by mouth daily.   NON FORMULARY Pt uses a cpap nightly   oxyCODONE  5 MG immediate release tablet Commonly known as: Oxy IR/ROXICODONE  Take 1-2 tablets (5-10 mg total) by mouth every 6 (six) hours as needed for moderate pain (pain score 4-6).   simvastatin 20 MG tablet Commonly known as: ZOCOR Take 20 mg by mouth at bedtime.   valACYclovir 500 MG tablet Commonly known as: VALTREX Take 500 mg by mouth daily.        Follow-up Information     Eletha Boas, MD. Schedule an appointment as soon as possible for a visit in 3  week(s).   Specialty: General Surgery Why: For wound re-check Contact information: [address] [place] [address] [phone]                 Boas Eletha, MD [facility] Office: [phone]   Signed: Boas Eletha 10/05/2024, 10:07 AM

## 2024-10-08 LAB — SURGICAL PATHOLOGY

## 2024-10-09 ENCOUNTER — Ambulatory Visit: Payer: Self-pay | Admitting: Surgery

## 2024-10-09 NOTE — Progress Notes (Signed)
 Good news!  Pathology is benign.  No cancer.  Will wait about a month and check TSH level to see if patient will require thyroid  hormone supplementation.  Krystal Spinner, MD [facility] practice Office: [phone]

## 2024-10-22 DIAGNOSIS — R030 Elevated blood-pressure reading, without diagnosis of hypertension: Secondary | ICD-10-CM | POA: Diagnosis not present

## 2024-10-22 DIAGNOSIS — E291 Testicular hypofunction: Secondary | ICD-10-CM | POA: Diagnosis not present

## 2024-10-22 DIAGNOSIS — G4733 Obstructive sleep apnea (adult) (pediatric): Secondary | ICD-10-CM | POA: Diagnosis not present
